# Patient Record
Sex: Female | Born: 1970 | Race: Black or African American | Hispanic: No | Marital: Married | Smoking: Never smoker
Health system: Southern US, Community
[De-identification: ages and names within clinical notes are randomized; demographics above are authoritative.]

## PROBLEM LIST (undated history)

## (undated) DIAGNOSIS — E119 Type 2 diabetes mellitus without complications: Secondary | ICD-10-CM

## (undated) DIAGNOSIS — G43809 Other migraine, not intractable, without status migrainosus: Secondary | ICD-10-CM

## (undated) DIAGNOSIS — I10 Essential (primary) hypertension: Secondary | ICD-10-CM

## (undated) HISTORY — DX: Essential (primary) hypertension: I10

## (undated) HISTORY — DX: Type 2 diabetes mellitus without complications: E11.9

---
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
## 2015-11-19 HISTORY — PX: ANKLE SURGERY: SHX546

## 2017-02-17 DIAGNOSIS — I10 Essential (primary) hypertension: Secondary | ICD-10-CM | POA: Insufficient documentation

## 2017-02-17 DIAGNOSIS — E119 Type 2 diabetes mellitus without complications: Secondary | ICD-10-CM | POA: Insufficient documentation

## 2017-03-25 DIAGNOSIS — K760 Fatty (change of) liver, not elsewhere classified: Secondary | ICD-10-CM | POA: Insufficient documentation

## 2017-08-16 LAB — HM PAP SMEAR: HM PAP: ABNORMAL

## 2017-08-24 DIAGNOSIS — R87610 Atypical squamous cells of undetermined significance on cytologic smear of cervix (ASC-US): Secondary | ICD-10-CM | POA: Insufficient documentation

## 2017-09-02 DIAGNOSIS — Z91018 Allergy to other foods: Secondary | ICD-10-CM | POA: Insufficient documentation

## 2017-11-18 HISTORY — PX: HERNIA REPAIR: SHX51

## 2018-02-04 DIAGNOSIS — G4733 Obstructive sleep apnea (adult) (pediatric): Secondary | ICD-10-CM | POA: Insufficient documentation

## 2018-02-04 DIAGNOSIS — Z9989 Dependence on other enabling machines and devices: Secondary | ICD-10-CM

## 2018-03-03 ENCOUNTER — Encounter: Payer: Self-pay | Admitting: Osteopathic Medicine

## 2018-03-03 ENCOUNTER — Ambulatory Visit (INDEPENDENT_AMBULATORY_CARE_PROVIDER_SITE_OTHER): Payer: Managed Care, Other (non HMO) | Admitting: Osteopathic Medicine

## 2018-03-03 VITALS — BP 134/68 | HR 84 | Temp 97.6°F | Ht 67.0 in | Wt 254.9 lb

## 2018-03-03 DIAGNOSIS — E1165 Type 2 diabetes mellitus with hyperglycemia: Secondary | ICD-10-CM | POA: Diagnosis not present

## 2018-03-03 MED ORDER — LIRAGLUTIDE -WEIGHT MANAGEMENT 18 MG/3ML ~~LOC~~ SOPN: 3.0000 mg | PEN_INJECTOR | Freq: Every day | SUBCUTANEOUS | 0 refills | Status: DC

## 2018-03-03 NOTE — Patient Instructions (Addendum)
Plan:  Continue Metformin  Add Saxenda - see prescription and list   When on maximum dose Saxenda, we may need to add a third medication A1C recheck will be due 05/19/2018 or after than, but I'd like to see you sooner to check how you're doing on the medicines and discuss a few other diabetes topics for prevention of complications   Goal A1C is 7% - we can get there one way or another but let's avoid Insulin for now if we can!

## 2018-03-03 NOTE — Progress Notes (Signed)
HPI: Bridget Lawrence is a 47 y.o. female who  has no past medical history on file.  she presents to [facility] today, 03/03/18,  for chief complaint of: Establish care  Diabetes, High Blood Pressure   DM2 - poor A1C control >1 year, previous Dr had gone up on metformin, per patient other Rx not discussed. She was on Victoza in the past when moved here from [place], but was told this caused liver enzyme problems, reviewed liver labs and they were a bit high off the medicine too. Hx fatty liver. OK about diet/exercise but could be better.   HTN: controlled, no CP/SOB, no HA/VC   Past medical, surgical, social and family history reviewed:  Patient Active Problem List   Diagnosis Date Noted  . OSA on CPAP 02/04/2018  . Food allergy 09/02/2017  . ASCUS of cervix with negative high risk HPV 08/24/2017  . Fatty liver 03/25/2017  . Essential hypertension 02/17/2017  . Type 2 diabetes mellitus without complication, without long-term current use of insulin (HCC) 02/17/2017   Past Surgical History:  Procedure Laterality Date  . ANKLE SURGERY  11/19/2015   Social History   Tobacco Use  . Smoking status: Never Smoker  . Smokeless tobacco: Never Used  Substance Use Topics  . Alcohol use: Yes    Comment: 4/wk   Family History  Problem Relation Age of Onset  . Heart attack Mother   . Diabetes Mother   . High blood pressure Mother   . Stroke Mother   . Heart attack Sister   . Diabetes Sister   . High blood pressure Sister   . Diabetes Brother   . High blood pressure Brother   . Stroke Maternal Grandmother      Current medication list and allergy/intolerance information reviewed:      Allergies  Allergen Reactions  . Doxycycline Other (See Comments)    Pt states," I don't remember.' Pt states," I don't remember.'   . Gabapentin Other (See Comments)    Slurred speech.  Slurred speech.        Review of Systems:  Constitutional:  No   fever, no chills, No recent illness, No unintentional weight changes. No significant fatigue.   HEENT: No  headache, no vision change, no hearing change, No sore throat, No  sinus pressure  Cardiac: No  chest pain, No  pressure, No palpitations, No  Orthopnea  Respiratory:  No  shortness of breath. No  Cough  Gastrointestinal: No  abdominal pain, No  nausea, No  vomiting,  No  blood in stool, No  diarrhea, No  constipation   Musculoskeletal: No new myalgia/arthralgia  Skin: No  Rash, No other wounds/concerning lesions  Genitourinary: No  incontinence, No  abnormal genital bleeding, No abnormal genital discharge  Hem/Onc: No  easy bruising/bleeding, No  abnormal lymph node  Endocrine: No cold intolerance,  No heat intolerance. No polyuria/polydipsia/polyphagia   Neurologic: No  weakness, No  dizziness, No  slurred speech/focal weakness/facial droop  Psychiatric: No  concerns with depression, No  concerns with anxiety, No sleep problems, No mood problems  Exam:  BP 134/68 (BP Location: Left Arm, Patient Position: Sitting, Cuff Size: Large)   Pulse 84   Temp 97.6 F (36.4 C) (Oral)   Ht 5\' 7"  (1.702 m)   Wt 254 lb 14.4 oz (115.6 kg)   BMI 39.92 kg/m   Constitutional: VS see above. General Appearance: alert, well-developed, well-nourished, NAD  Eyes: Normal lids and  conjunctive, non-icteric sclera  Ears, Nose, Mouth, Throat: MMM, Normal external inspection ears/nares/mouth/lips/gums.   Neck: No masses, trachea midline. No thyroid enlargement. No tenderness/mass appreciated. No lymphadenopathy  Respiratory: Normal respiratory effort. no wheeze, no rhonchi, no rales  Cardiovascular: S1/S2 normal, no murmur, no rub/gallop auscultated. RRR. No lower extremity edema.   Musculoskeletal: Gait normal.   Neurological: Normal balance/coordination. No tremor. No cranial nerve deficit on limited exam. Motor and sensation intact and symmetric. Cerebellar reflexes intact.   Skin:  warm, dry, intact. No rash/ulcer.   Psychiatric: Normal judgment/insight. Normal mood and affect. Oriented x3.   Care Everywhere, last A1C >11 few weeks ago!   ASSESSMENT/PLAN:   Uncontrolled type 2 diabetes mellitus with hyperglycemia [facility])    Patient Instructions  Plan:  Continue Metformin  Add Saxenda - see prescription and list   When on maximum dose Saxenda, we may need to add a third medication A1C recheck will be due 05/19/2018 or after than, but I'd like to see you sooner to check how you're doing on the medicines and discuss a few other diabetes topics for prevention of complications   Goal A1C is 7% - we can get there one way or another but let's avoid Insulin for now if we can!        Visit summary with medication list and pertinent instructions was printed for patient to review. All questions at time of visit were answered - patient instructed to contact office with any additional concerns. ER/RTC precautions were reviewed with the patient.   Follow-up plan: Return in about 6 weeks (around 04/14/2018) for monitor diabetes (no A1C) and check on medications for sugars .    Please note: voice recognition software was used to produce this document, and typos may escape review. Please contact Dr. Lyn Hollingshead for any needed clarifications.

## 2018-03-04 ENCOUNTER — Telehealth: Payer: Self-pay

## 2018-03-04 DIAGNOSIS — Z6839 Body mass index (BMI) 39.0-39.9, adult: Secondary | ICD-10-CM | POA: Insufficient documentation

## 2018-03-04 NOTE — Telephone Encounter (Signed)
Called covermymeds and spoke with [facility] and he wanted to clarfiy the patients DOB and I confirmed the DOB on file. Bridget Lawrence will fax a form to the office to complete further information per insurance request.

## 2018-03-04 NOTE — Telephone Encounter (Signed)
Thanks for the update

## 2018-03-04 NOTE — Telephone Encounter (Signed)
I wanted to make sure this is for weight loss and diabetes management. Can you give me a Dx code for this medication? Please advise. Thanks

## 2018-03-04 NOTE — Telephone Encounter (Signed)
E11.9 Diabetes Z68.39 Obesity BMI 39.0-39.9 E66.1 Morbid Obesity

## 2018-03-04 NOTE — Telephone Encounter (Signed)
Lequita Halt from Cover my Meds left a vm msg stating that an error was made with obtaining PA approval for Saxenda. She is requesting a call back at [phone], Ref. # [id number]. Thanks.

## 2018-03-05 NOTE — Telephone Encounter (Signed)
Forms faxed to [facility] with Diagnoses codes on the form per Dr. Lyn Hollingshead. Awaiting determination.

## 2018-03-25 ENCOUNTER — Telehealth: Payer: Self-pay

## 2018-03-25 DIAGNOSIS — E119 Type 2 diabetes mellitus without complications: Secondary | ICD-10-CM

## 2018-03-25 MED ORDER — INSULIN PEN NEEDLE 32G X 6 MM MISC: 1 refills | Status: DC

## 2018-03-25 NOTE — Telephone Encounter (Signed)
Pt called stating she is in need of 32g needles for Saxenda rx. She is requesting rx be sent to [facility] in [place]. Thanks.

## 2018-04-07 ENCOUNTER — Other Ambulatory Visit: Payer: Self-pay | Admitting: Osteopathic Medicine

## 2018-04-07 NOTE — Telephone Encounter (Signed)
[facility] requesting RF on Saxenda.   Last sent 03-03-18 for #3

## 2018-04-08 NOTE — Telephone Encounter (Signed)
Sent!

## 2018-04-14 ENCOUNTER — Encounter: Payer: Self-pay | Admitting: Osteopathic Medicine

## 2018-04-14 ENCOUNTER — Ambulatory Visit (INDEPENDENT_AMBULATORY_CARE_PROVIDER_SITE_OTHER): Payer: Managed Care, Other (non HMO) | Admitting: Osteopathic Medicine

## 2018-04-14 VITALS — BP 131/83 | HR 114 | Temp 99.6°F | Wt 253.8 lb

## 2018-04-14 DIAGNOSIS — Z6839 Body mass index (BMI) 39.0-39.9, adult: Secondary | ICD-10-CM | POA: Diagnosis not present

## 2018-04-14 DIAGNOSIS — E119 Type 2 diabetes mellitus without complications: Secondary | ICD-10-CM

## 2018-04-14 DIAGNOSIS — J069 Acute upper respiratory infection, unspecified: Secondary | ICD-10-CM | POA: Diagnosis not present

## 2018-04-14 MED ORDER — LIRAGLUTIDE -WEIGHT MANAGEMENT 18 MG/3ML ~~LOC~~ SOPN: 3.0000 mg | PEN_INJECTOR | Freq: Every day | SUBCUTANEOUS | 3 refills | Status: DC

## 2018-04-14 MED ORDER — OLMESARTAN MEDOXOMIL-HCTZ 20-12.5 MG PO TABS: 1.0000 | ORAL_TABLET | Freq: Every day | ORAL | 3 refills | Status: DC

## 2018-04-14 MED ORDER — METFORMIN HCL 500 MG PO TABS: 1000.0000 mg | ORAL_TABLET | Freq: Two times a day (BID) | ORAL | 3 refills | Status: DC

## 2018-04-14 NOTE — Progress Notes (Signed)
HPI: Bridget Lawrence is a 47 y.o. female who  has a past medical history of Diabetes (HCC) and High blood pressure.  she presents to [facility] today, 04/14/18,  for chief complaint of: Follow up: Diabetes, High Blood Pressure   DM2 - poor A1C control >1 year, previous Dr had gone up on metformin, per patient other Rx not discussed. She was on Victoza in the past when moved here from [place], but was told this caused liver enzyme problems, reviewed liver labs and they were a bit high off the medicine too. Hx fatty liver. OK about diet/exercise but could be better. At her visit to establish with me about a month ago, 03/03/18, we decided continue Metformin, adding Saxenda, consider third agent. She was recently in the hospital for surgery - 03/23/18 A1C 9.2% down from 02/17/18 was 11.1%. She states the AM sugars have been around 125, after eating 160-170's. Saxenda tolerated well, except itching some at site of insertion. 04/02/18 had labs done at [facility]   HTN: controlled, no CP/SOB, no HA/VC  New cough and sore throat, <1 day, feels like might be coming down with somehting.   Past medical, surgical, social and family history reviewed:  Patient Active Problem List   Diagnosis Date Noted  . BMI 39.0-39.9,adult 03/04/2018  . Morbid obesity (HCC) 03/04/2018  . OSA on CPAP 02/04/2018  . Food allergy 09/02/2017  . ASCUS of cervix with negative high risk HPV 08/24/2017  . Fatty liver 03/25/2017  . Essential hypertension 02/17/2017  . Type 2 diabetes mellitus without complication, without long-term current use of insulin (HCC) 02/17/2017   Past Surgical History:  Procedure Laterality Date  . ANKLE SURGERY  11/19/2015   Social History   Tobacco Use  . Smoking status: Never Smoker  . Smokeless tobacco: Never Used  Substance Use Topics  . Alcohol use: Yes    Comment: 4/wk   Family History  Problem Relation Age of Onset  . Heart attack  Mother   . Diabetes Mother   . High blood pressure Mother   . Stroke Mother   . Heart attack Sister   . Diabetes Sister   . High blood pressure Sister   . Diabetes Brother   . High blood pressure Brother   . Stroke Maternal Grandmother      Current medication list and allergy/intolerance information reviewed:      Allergies  Allergen Reactions  . Doxycycline Other (See Comments)    Pt states," I don't remember.' Pt states," I don't remember.'   . Gabapentin Other (See Comments)    Slurred speech.  Slurred speech.        Review of Systems:  Constitutional:  No  fever, no chills  HEENT: No  headache  Cardiac: No  chest pain, No  pressure, No palpitations  Respiratory:  No  shortness of breath.   Gastrointestinal: No  abdominal pain, No  nausea, No  vomiting  Musculoskeletal: No new myalgia/arthralgia  Skin: No  Rash  Endocrine: No polyuria/polydipsia/polyphagia   Neurologic: No  weakness, No  dizziness  Exam:  BP 131/83 (BP Location: Left Arm, Patient Position: Sitting, Cuff Size: Large)   Pulse (!) 114   Temp 99.6 F (37.6 C) (Oral)   Wt 253 lb 12.8 oz (115.1 kg)   BMI 39.75 kg/m   Constitutional: VS see above. General Appearance: alert, well-developed, well-nourished, NAD  Eyes: Normal lids and conjunctive, non-icteric sclera  Ears, Nose, Mouth, Throat: MMM,  Normal external inspection ears/nares/mouth/lips/gums.   Neck: No masses, trachea midline.   Respiratory: Normal respiratory effort. no wheeze, no rhonchi, no rales  Cardiovascular: S1/S2 normal, no murmur, no rub/gallop auscultated. RRR. No lower extremity edema.   Skin: warm, dry, intact. No rash/ulcer.   Psychiatric: Normal judgment/insight. Normal mood and affect. Oriented x3.   Pt labs from [facility]: AST, ALT improved bu not normal, A1C 9    ASSESSMENT/PLAN:   Type 2 diabetes mellitus without complication, without long-term current use of insulin (HCC) - Some itching w/ Saxends  injections, no serious allergy, pt not interested in switching meds at this time, RTC/ER precautions reviewed, advised 2nd Gen AH. A1C better, pt would like to avoid triple therapy for noe - Plan: COMPLETE METABOLIC PANEL WITH GFR  BMI 39.0-39.9,adult - no major weight loss on Saxenda, will monitor   Viral URI - printed OTC instructions, see me if worse     Patient Instructions  For itching: Can try OTC allergy meds daily to prevent itching, Zyrtec, Claritin, Allegra or any of their generics. Can also use cream: benadryl or hydrocortisone      Over-the-Counter Medications & Home Remedies for Upper Respiratory Illness  Note: the following list assumes no pregnancy, normal liver & kidney function and no other drug interactions. Dr. Lyn Hollingshead has highlighted medications which are safe for you to use, but these may not be appropriate for everyone. Always ask a pharmacist or qualified medical provider if you have any questions!   Aches/Pains, Fever, Headache Acetaminophen (Tylenol) 500 mg tablets - take max 2 tablets (1000 mg) every 6 hours (4 times per day)  Ibuprofen (Motrin) 200 mg tablets - take max 4 tablets (800 mg) every 6 hours*  Sinus Congestion Prescription Atrovent as directed Nasal Saline if desired Oxymetolazone (Afrin, others) sparing use due to rebound congestion, NEVER use in kids Phenylephrine (Sudafed) 10 mg tablets every 4 hours (or the 12-hour formulation)* Diphenhydramine (Benadryl) 25 mg tablets - take max 2 tablets every 4 hours  Cough & Sore Throat Prescription cough pills or syrups as directed Dextromethorphan (Robitussin, others) - cough suppressant Guaifenesin (Robitussin, Mucinex, others) - expectorant (helps cough up mucus) (Dextromethorphan and Guaifenesin also come in a combination tablet) Lozenges w/ Benzocaine + Menthol (Cepacol) Honey - as much as you want! Teas which "coat the throat" - look for ingredients Elm Bark, Licorice Root, Marshmallow  Root  Other Antibiotics if these are prescribed - take ALL, even if you're feeling better  Zinc Lozenges within 24 hours of symptoms onset - mixed evidence this shortens the duration of the common cold Don't waste your money on Vitamin C or Echinacea  *Caution in patients with high blood pressure       Visit summary with medication list and pertinent instructions was printed for patient to review. All questions at time of visit were answered - patient instructed to contact office with any additional concerns. ER/RTC precautions were reviewed with the patient.   Follow-up plan: Return for A1C around 06/23/18.    Please note: voice recognition software was used to produce this document, and typos may escape review. Please contact Dr. Lyn Hollingshead for any needed clarifications.

## 2018-04-14 NOTE — Patient Instructions (Addendum)
For itching: Can try OTC allergy meds daily to prevent itching, Zyrtec, Claritin, Allegra or any of their generics. Can also use cream: benadryl or hydrocortisone      Over-the-Counter Medications & Home Remedies for Upper Respiratory Illness  Note: the following list assumes no pregnancy, normal liver & kidney function and no other drug interactions. Dr. Lyn Hollingshead has highlighted medications which are safe for you to use, but these may not be appropriate for everyone. Always ask a pharmacist or qualified medical provider if you have any questions!   Aches/Pains, Fever, Headache Acetaminophen (Tylenol) 500 mg tablets - take max 2 tablets (1000 mg) every 6 hours (4 times per day)  Ibuprofen (Motrin) 200 mg tablets - take max 4 tablets (800 mg) every 6 hours*  Sinus Congestion Prescription Atrovent as directed Nasal Saline if desired Oxymetolazone (Afrin, others) sparing use due to rebound congestion, NEVER use in kids Phenylephrine (Sudafed) 10 mg tablets every 4 hours (or the 12-hour formulation)* Diphenhydramine (Benadryl) 25 mg tablets - take max 2 tablets every 4 hours  Cough & Sore Throat Prescription cough pills or syrups as directed Dextromethorphan (Robitussin, others) - cough suppressant Guaifenesin (Robitussin, Mucinex, others) - expectorant (helps cough up mucus) (Dextromethorphan and Guaifenesin also come in a combination tablet) Lozenges w/ Benzocaine + Menthol (Cepacol) Honey - as much as you want! Teas which "coat the throat" - look for ingredients Elm Bark, Licorice Root, Marshmallow Root  Other Antibiotics if these are prescribed - take ALL, even if you're feeling better  Zinc Lozenges within 24 hours of symptoms onset - mixed evidence this shortens the duration of the common cold Don't waste your money on Vitamin C or Echinacea  *Caution in patients with high blood pressure

## 2018-06-16 ENCOUNTER — Telehealth: Payer: Self-pay

## 2018-06-16 ENCOUNTER — Other Ambulatory Visit: Payer: Self-pay | Admitting: Osteopathic Medicine

## 2018-06-16 MED ORDER — LIRAGLUTIDE -WEIGHT MANAGEMENT 18 MG/3ML ~~LOC~~ SOPN: 3.0000 mg | PEN_INJECTOR | Freq: Every day | SUBCUTANEOUS | 3 refills | Status: DC

## 2018-06-16 NOTE — Telephone Encounter (Signed)
As per pt - she was informed by [facility] that Saxenda rx will no longer be covered by ins as a 30d supply. Pt called and verify that insurance will pay for a 90 day rx. She is requesting a 90d Saxenda rx be sent to the pharmacy. Pls advise, thanks.

## 2018-06-16 NOTE — Telephone Encounter (Signed)
90day supply sent.

## 2018-06-17 NOTE — Telephone Encounter (Signed)
Left VM with status update.  

## 2018-06-18 ENCOUNTER — Telehealth: Payer: Self-pay

## 2018-06-18 MED ORDER — LIRAGLUTIDE -WEIGHT MANAGEMENT 18 MG/3ML ~~LOC~~ SOPN: 3.0000 mg | PEN_INJECTOR | Freq: Every day | SUBCUTANEOUS | 3 refills | Status: DC

## 2018-06-18 NOTE — Telephone Encounter (Signed)
Pt has been updated.  

## 2018-06-18 NOTE — Telephone Encounter (Signed)
Sent!

## 2018-06-18 NOTE — Telephone Encounter (Signed)
Pt requesting for Saxenda to be sent to [facility]. Rx was cancelled at [facility] (spoke to pharmacist Toni Amend). Pls send new rx to [facility]. Thanks.

## 2018-06-22 NOTE — Telephone Encounter (Signed)
Message from Plan CaseId:[id number];Status:Approved;Review Type:Prior Auth;Coverage Start Date:05/23/2018;Coverage End Date:10/20/2018; -hsm.

## 2018-06-23 ENCOUNTER — Ambulatory Visit: Payer: Managed Care, Other (non HMO) | Admitting: Osteopathic Medicine

## 2018-06-30 ENCOUNTER — Ambulatory Visit (INDEPENDENT_AMBULATORY_CARE_PROVIDER_SITE_OTHER): Payer: Managed Care, Other (non HMO) | Admitting: Osteopathic Medicine

## 2018-06-30 ENCOUNTER — Encounter: Payer: Self-pay | Admitting: Osteopathic Medicine

## 2018-06-30 ENCOUNTER — Ambulatory Visit (INDEPENDENT_AMBULATORY_CARE_PROVIDER_SITE_OTHER): Payer: Managed Care, Other (non HMO)

## 2018-06-30 VITALS — BP 127/61 | HR 90 | Temp 98.7°F | Resp 96 | Wt 253.6 lb

## 2018-06-30 DIAGNOSIS — R059 Cough, unspecified: Secondary | ICD-10-CM

## 2018-06-30 DIAGNOSIS — G4489 Other headache syndrome: Secondary | ICD-10-CM | POA: Diagnosis not present

## 2018-06-30 DIAGNOSIS — R0602 Shortness of breath: Secondary | ICD-10-CM | POA: Diagnosis not present

## 2018-06-30 DIAGNOSIS — R0789 Other chest pain: Secondary | ICD-10-CM

## 2018-06-30 DIAGNOSIS — J3489 Other specified disorders of nose and nasal sinuses: Secondary | ICD-10-CM

## 2018-06-30 DIAGNOSIS — R05 Cough: Secondary | ICD-10-CM | POA: Diagnosis not present

## 2018-06-30 DIAGNOSIS — E119 Type 2 diabetes mellitus without complications: Secondary | ICD-10-CM

## 2018-06-30 LAB — POCT GLYCOSYLATED HEMOGLOBIN (HGB A1C): HEMOGLOBIN A1C: 7.5 % — AB (ref 4.0–5.6)

## 2018-06-30 MED ORDER — FLUTICASONE PROPIONATE 50 MCG/ACT NA SUSP: NASAL | 3 refills | Status: DC

## 2018-06-30 MED ORDER — AZITHROMYCIN 250 MG PO TABS: ORAL_TABLET | ORAL | 0 refills | Status: DC

## 2018-06-30 NOTE — Patient Instructions (Addendum)
Your symptoms aren't really consistent with a serious respiratory problem such as pneumonia, asthma, or COPD. They're also not really concerning for a serious cardiac problem like heart disease or blood clot. Im more suspicious of a bronchitis or sinus infection or allergy reaction, but given the vague and intermittent nature of symptoms we should keep other things in mind especially if you aren't getting better as expected.    We will get a chest Xray for further evaluation of pneumonia or other lung issue. Will get labs today as well. One of these labs is ordered STAT to let us know if the heart is at risk, if this level (troponin) is positive, you will get a call to go to the closes hospital ER!   We should consider lung function testing in the office to rule out asthma/COPD or other lung disease, or stress test/cardiology referral to rule out heart disease.   We will go ahead and treat with antibiotics Azithromycin for bacterial sinusitis/bronchitis which may be causing your intermittent symptoms. I also suspect possible allergies so will add a steroid nasal spray Flonase and I'd recommend consistent use of daily Claritin 10 mg for a week or so. We should consider oral steroid burst and/or steroid inhaler if these measures aren't helping.   If your pain worsens, especially if more short winded or more severe chest pain, please call 911 or proceed to the nearest emergency room! I don't have a strong suspicion at this point for a cardiac issue, and EKG today looks okay, but if something changes, please take it seriously!

## 2018-06-30 NOTE — Progress Notes (Signed)
HPI: Bridget Lawrence is a 47 y.o. female who  has a past medical history of Diabetes (HCC) and High blood pressure.  she presents to [facility] today, 06/30/18,  for chief complaint of:  Respiratory complaints   Chest tightness and SOB on and off for a few weeks now. New frontal headache in middle-ish forehead near eyebrows, alternating sinus pressure L and R, ear aches. Chest tightness and SOB on exertion more concerning, no orthopnea, no significant coughing. OTC sinus meds and nebs not helping. Nasonex/Flonase previously.     Past medical history, surgical history, and family history reviewed.  Current medication list and allergy/intolerance information reviewed.   (See remainder of HPI, ROS, Phys Exam below)  BP 127/61 (BP Location: Left Arm, Patient Position: Sitting, Cuff Size: Large)   Pulse 90   Temp 98.7 F (37.1 C) (Oral)   Resp (!) 96   Wt 253 lb 9.6 oz (115 kg)   BMI 39.72 kg/m   Results for orders placed or performed in visit on 06/30/18 (from the past 24 hour(s))  POCT HgB A1C     Status: Abnormal   Collection Time: 06/30/18  3:31 PM  Result Value Ref Range   Hemoglobin A1C 7.5 (A) 4.0 - 5.6 %   HbA1c POC (<> result, manual entry)     HbA1c, POC (prediabetic range)     HbA1c, POC (controlled diabetic range)       ASSESSMENT/PLAN: The primary encounter diagnosis was Feeling of chest tightness. Diagnoses of Sinus pressure, Cough, Other headache syndrome, Type 2 diabetes mellitus without complication, without long-term current use of insulin (HCC), and Short of breath on exertion were also pertinent to this visit.  Intermittent symptoms, simplest explanation is uncontrolled allergies w/ possible asthma component but new onset chest tightness (no palpitations or SOB at rest or mild exertion, only w/ climbing stairs) in a middle-aged HTN/DM pt concerning for atypical chest pain presentation.   Troponin ordered. EKG ok in office.  On-call provider is informed. Pt is informed as below.    Orders Placed This Encounter  Procedures  . DG Chest 2 View  . CBC with Differential/Platelet  . COMPLETE METABOLIC PANEL WITH GFR  . TSH  . B Nat Peptide  . Troponin I  . POCT HgB A1C     Meds ordered this encounter  Medications  . azithromycin (ZITHROMAX) 250 MG tablet    Sig: 2 tabs po on Day 1, then 1 tab daily Days 2 - 5    Dispense:  6 tablet    Refill:  0  . fluticasone (FLONASE) 50 MCG/ACT nasal spray    Sig: One spray in each nostril twice a day, use left hand for right nostril, and right hand for left nostril.    Dispense:  48 g    Refill:  3    Patient Instructions  Your symptoms aren't really consistent with a serious respiratory problem such as pneumonia, asthma, or COPD. They're also not really concerning for a serious cardiac problem like heart disease or blood clot. Im more suspicious of a bronchitis or sinus infection or allergy reaction, but given the vague and intermittent nature of symptoms we should keep other things in mind especially if you aren't getting better as expected.    We will get a chest Xray for further evaluation of pneumonia or other lung issue. Will get labs today as well. One of these labs is ordered STAT to let us know if the  heart is at risk, if this level (troponin) is positive, you will get a call to go to the closes hospital ER!   We should consider lung function testing in the office to rule out asthma/COPD or other lung disease, or stress test/cardiology referral to rule out heart disease.   We will go ahead and treat with antibiotics Azithromycin for bacterial sinusitis/bronchitis which may be causing your intermittent symptoms. I also suspect possible allergies so will add a steroid nasal spray Flonase and I'd recommend consistent use of daily Claritin 10 mg for a week or so. We should consider oral steroid burst and/or steroid inhaler if these measures aren't helping.   If your  pain worsens, especially if more short winded or more severe chest pain, please call 911 or proceed to the nearest emergency room! I don't have a strong suspicion at this point for a cardiac issue, and EKG today looks okay, but if something changes, please take it seriously!       Follow-up plan: Return in about 1 week (around 07/07/2018) for recheck symptoms .                 ############################################ ############################################ ############################################ ############################################    Outpatient Encounter Medications as of 06/30/2018  Medication Sig Note  . aspirin EC 325 MG tablet Take by mouth.   Marland Kitchen azelastine (ASTELIN) 0.1 % nasal spray U 1 SPR IEN BID UTD 04/14/2018: PRN  . B-D ULTRA-FINE 33 LANCETS MISC by Does not apply route.   . Cholecalciferol (VITAMIN D) 2000 units CAPS Take by mouth.   . fluconazole (DIFLUCAN) 150 MG tablet 1 by mouth every day now and repeat in 3 days   . glucose blood (ONE TOUCH ULTRA TEST) test strip Use to check blood glucose daily and as needed   . Glutamic Acid HCl (L-GLUTAMIC ACID PO) Take by mouth.   . Insulin Pen Needle (NOVOFINE) 32G X 6 MM MISC Use subcutaneously daily with Liraglutide   . ipratropium-albuterol (DUONEB) 0.5-2.5 (3) MG/3ML SOLN 3 mLs. 04/14/2018: PRN  . Liraglutide -Weight Management (SAXENDA) 18 MG/3ML SOPN Inject 3 mg into the skin daily.   . metFORMIN (GLUCOPHAGE) 500 MG tablet Take 2 tablets (1,000 mg total) by mouth 2 (two) times daily with a meal.   . Methylcobalamin (METHYL B-12) 1000 MCG LOZG Take by mouth.   . olmesartan-hydrochlorothiazide (BENICAR HCT) 20-12.5 MG tablet Take 1 tablet by mouth daily.   . Probiotic Product (PROBIOTIC-10) CAPS Take by mouth.   . thiamine (VITAMIN B-1) 50 MG tablet Take by mouth.   Marland Kitchen VITAMIN K PO Take by mouth.   . Zinc 100 MG TABS Take by mouth.   . Zinc Sulfate (ZINC 15 PO) Take by mouth.    No  facility-administered encounter medications on file as of 06/30/2018.    Allergies  Allergen Reactions  . Doxycycline Other (See Comments)    Pt states," I don't remember.'    . Gabapentin Other (See Comments)    Slurred speech.         Review of Systems:  Constitutional: No recent illness  HEENT: +  headache, no vision change  Cardiac: +chest pain, +pressure, No palpitations  Respiratory: +shortness of breath. No  Cough  Gastrointestinal: No  abdominal pain, no change on bowel habits  Musculoskeletal: No new myalgia/arthralgia  Neurologic: No  weakness, No  Dizziness  Exam:  BP 127/61 (BP Location: Left Arm, Patient Position: Sitting, Cuff Size: Large)   Pulse 90   Temp  98.7 F (37.1 C) (Oral)   Resp (!) 96   Wt 253 lb 9.6 oz (115 kg)   BMI 39.72 kg/m   Constitutional: VS see above. General Appearance: alert, well-developed, well-nourished, NAD  Eyes: Normal lids and conjunctive, non-icteric sclera  Ears, Nose, Mouth, Throat: MMM, Normal external inspection ears/nares/mouth/lips/gums.  Neck: No masses, trachea midline.   Respiratory: Normal respiratory effort. no wheeze, no rhonchi, no rales  Cardiovascular: S1/S2 normal, no murmur, no rub/gallop auscultated. RRR.   Musculoskeletal: Gait normal. Symmetric and independent movement of all extremities  Neurological: Normal balance/coordination. No tremor.  Skin: warm, dry, intact.   Psychiatric: Normal judgment/insight. Normal mood and affect. Oriented x3.   Visit summary with medication list and pertinent instructions was printed for patient to review, advised to alert [place] if any changes needed. All questions at time of visit were answered - patient instructed to contact office with any additional concerns. ER/RTC precautions were reviewed with the patient and understanding verbalized.   Follow-up plan: Return in about 1 week (around 07/07/2018) for recheck symptoms .  Note: Total time spent 25 minutes,  greater than 50% of the visit was spent face-to-face counseling and coordinating care for the following: The primary encounter diagnosis was Feeling of chest tightness. Diagnoses of Sinus pressure, Cough, Other headache syndrome, Type 2 diabetes mellitus without complication, without long-term current use of insulin (HCC), and Short of breath on exertion were also pertinent to this visit.Marland Kitchen  Please note: voice recognition software was used to produce this document, and typos may escape review. Please contact Dr. Lyn Hollingshead for any needed clarifications.

## 2018-07-01 ENCOUNTER — Telehealth: Payer: Self-pay
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
## 2018-07-01 NOTE — Addendum Note (Signed)
Addended by: Delfino Lovett on: 07/01/2018 08:23 AM   Modules accepted: Orders

## 2018-07-01 NOTE — Telephone Encounter (Signed)
noted 

## 2018-07-03 ENCOUNTER — Other Ambulatory Visit: Payer: Self-pay | Admitting: Family Medicine

## 2018-07-03 LAB — TROPONIN T

## 2018-07-04 LAB — CBC/DIFF AMBIGUOUS DEFAULT
BASOS: 0 %
Basophils Absolute: 0 10*3/uL (ref 0.0–0.2)
EOS (ABSOLUTE): 0.1 10*3/uL (ref 0.0–0.4)
Eos: 1 %
HEMATOCRIT: 36.6 % (ref 34.0–46.6)
HEMOGLOBIN: 11.6 g/dL (ref 11.1–15.9)
IMMATURE GRANS (ABS): 0 10*3/uL (ref 0.0–0.1)
IMMATURE GRANULOCYTES: 0 %
Lymphocytes Absolute: 2.4 10*3/uL (ref 0.7–3.1)
Lymphs: 38 %
MCH: 27.6 pg (ref 26.6–33.0)
MCHC: 31.7 g/dL (ref 31.5–35.7)
MCV: 87 fL (ref 79–97)
MONOS ABS: 0.6 10*3/uL (ref 0.1–0.9)
Monocytes: 9 %
NEUTROS ABS: 3.3 10*3/uL (ref 1.4–7.0)
Neutrophils: 52 %
Platelets: 359 10*3/uL (ref 150–450)
RBC: 4.2 x10E6/uL (ref 3.77–5.28)
RDW: 14.1 % (ref 12.3–15.4)
WBC: 6.4 10*3/uL (ref 3.4–10.8)

## 2018-07-04 LAB — TSH: TSH: 3.37 u[IU]/mL (ref 0.450–4.500)

## 2018-07-04 LAB — COMPREHENSIVE METABOLIC PANEL
ALBUMIN: 4.3 g/dL (ref 3.5–5.5)
ALK PHOS: 59 IU/L (ref 39–117)
ALT: 75 IU/L — ABNORMAL HIGH (ref 0–32)
AST: 59 IU/L — AB (ref 0–40)
Albumin/Globulin Ratio: 1.3 (ref 1.2–2.2)
BUN / CREAT RATIO: 22 (ref 9–23)
BUN: 15 mg/dL (ref 6–24)
Bilirubin Total: 0.3 mg/dL (ref 0.0–1.2)
CALCIUM: 9.4 mg/dL (ref 8.7–10.2)
CO2: 23 mmol/L (ref 20–29)
Chloride: 98 mmol/L (ref 96–106)
Creatinine, Ser: 0.68 mg/dL (ref 0.57–1.00)
GFR calc Af Amer: 119 mL/min/{1.73_m2} (ref >59)
GFR, EST NON AFRICAN AMERICAN: 103 mL/min/{1.73_m2} (ref >59)
GLOBULIN, TOTAL: 3.3 g/dL (ref 1.5–4.5)
GLUCOSE: 143 mg/dL — AB (ref 65–99)
Potassium: 4 mmol/L (ref 3.5–5.2)
SODIUM: 136 mmol/L (ref 134–144)
Total Protein: 7.6 g/dL (ref 6.0–8.5)

## 2018-07-04 LAB — BRAIN NATRIURETIC PEPTIDE: BNP: 3 pg/mL (ref 0.0–100.0)

## 2018-07-04 LAB — SPECIMEN STATUS REPORT

## 2018-07-07 ENCOUNTER — Encounter: Payer: Self-pay | Admitting: Osteopathic Medicine

## 2018-07-09 ENCOUNTER — Ambulatory Visit: Payer: Managed Care, Other (non HMO) | Admitting: Osteopathic Medicine

## 2018-07-13 ENCOUNTER — Ambulatory Visit (INDEPENDENT_AMBULATORY_CARE_PROVIDER_SITE_OTHER): Payer: Managed Care, Other (non HMO) | Admitting: Osteopathic Medicine

## 2018-07-13 ENCOUNTER — Encounter: Payer: Self-pay | Admitting: Osteopathic Medicine

## 2018-07-13 VITALS — BP 121/83 | HR 83 | Temp 98.4°F | Wt 253.3 lb

## 2018-07-13 DIAGNOSIS — R748 Abnormal levels of other serum enzymes: Secondary | ICD-10-CM

## 2018-07-13 DIAGNOSIS — R0789 Other chest pain: Secondary | ICD-10-CM

## 2018-07-13 NOTE — Progress Notes (Signed)
HPI: Bridget Lawrence is a 47 y.o. female who  has a past medical history of Diabetes (HCC) and High blood pressure.  she presents to [facility] today, 07/13/18,  for chief complaint of:  Follow-up breathing  Doing better with chest tightness, seems to have resolved. The nasal spray is also helping.   She has reported the issue she experienced at the lab to [facility]. Will follow up as needed. See prvious documentation.       Past medical history, surgical history, and family history reviewed.  Current medication list and allergy/intolerance information reviewed.   (See remainder of HPI, ROS, Phys Exam below)    ASSESSMENT/PLAN:   Feeling of chest tightness - resolved at this time, monitor as needed  Elevated liver enzymes - mild, likely fatty liver, will monitor - advised diet/wt loss     Follow-up plan: Return in about 4 months (around 11/12/2018) for recheck A1C, sooner if needed .                 ############################################ ############################################ ############################################ ############################################    Outpatient Encounter Medications as of 07/13/2018  Medication Sig Note  . azithromycin (ZITHROMAX) 250 MG tablet 2 tabs po on Day 1, then 1 tab daily Days 2 - 5   . Cholecalciferol (VITAMIN D) 2000 units CAPS Take by mouth.   . fluticasone (FLONASE) 50 MCG/ACT nasal spray One spray in each nostril twice a day, use left hand for right nostril, and right hand for left nostril.   Marland Kitchen glucose blood (ONE TOUCH ULTRA TEST) test strip Use to check blood glucose daily and as needed   . Glutamic Acid HCl (L-GLUTAMIC ACID PO) Take by mouth.   . Insulin Pen Needle (NOVOFINE) 32G X 6 MM MISC Use subcutaneously daily with Liraglutide   . ipratropium-albuterol (DUONEB) 0.5-2.5 (3) MG/3ML SOLN 3 mLs. 04/14/2018: PRN  . Liraglutide -Weight Management (SAXENDA) 18 MG/3ML  SOPN Inject 3 mg into the skin daily.   . metFORMIN (GLUCOPHAGE) 500 MG tablet Take 2 tablets (1,000 mg total) by mouth 2 (two) times daily with a meal.   . Methylcobalamin (METHYL B-12) 1000 MCG LOZG Take by mouth.   . olmesartan-hydrochlorothiazide (BENICAR HCT) 20-12.5 MG tablet Take 1 tablet by mouth daily.   . Probiotic Product (PROBIOTIC-10) CAPS Take by mouth.   . thiamine (VITAMIN B-1) 50 MG tablet Take by mouth.   Marland Kitchen VITAMIN K PO Take by mouth.   . Zinc 100 MG TABS Take by mouth.   . Zinc Sulfate (ZINC 15 PO) Take by mouth.    No facility-administered encounter medications on file as of 07/13/2018.    Allergies  Allergen Reactions  . Doxycycline Other (See Comments)    Pt states," I don't remember.'    . Gabapentin Other (See Comments)    Slurred speech.         Review of Systems:  Constitutional: +recent illness  HEENT: No  headache  Cardiac: No  chest pain, No  pressure, No palpitations  Respiratory:  No  shortness of breath. No  Cough  Musculoskeletal: No new myalgia/arthralgia  Skin: No  Rash  Hem/Onc: No  easy bruising/bleeding, No  abnormal lumps/bumps  Neurologic: No  weakness, No  Dizziness   Exam:  BP 121/83 (BP Location: Left Arm, Patient Position: Sitting, Cuff Size: Large)   Pulse 83   Temp 98.4 F (36.9 C) (Oral)   Wt 253 lb 4.8 oz (114.9 kg)   BMI 39.67 kg/m  Constitutional: VS see above. General Appearance: alert, well-developed, well-nourished, NAD  Eyes: Normal lids and conjunctive, non-icteric sclera  Ears, Nose, Mouth, Throat: MMM, Normal external inspection ears/nares/mouth/lips/gums.  Neck: No masses, trachea midline.   Respiratory: Normal respiratory effort. no wheeze, no rhonchi, no rales  Cardiovascular: S1/S2 normal, no murmur, no rub/gallop auscultated. RRR.   Musculoskeletal: Gait normal. Symmetric and independent movement of all extremities  Neurological: Normal balance/coordination. No tremor.  Skin: warm, dry,  intact.   Psychiatric: Normal judgment/insight. Normal mood and affect. Oriented x3.   Visit summary with medication list and pertinent instructions was printed for patient to review, advised to alert [place] if any changes needed. All questions at time of visit were answered - patient instructed to contact office with any additional concerns. ER/RTC precautions were reviewed with the patient and understanding verbalized.   Follow-up plan: Return in about 4 months (around 11/12/2018) for recheck A1C, sooner if needed .  Note: Total time spent 15 minutes, greater than 50% of the visit was spent face-to-face counseling and coordinating care for the following: The primary encounter diagnosis was Feeling of chest tightness. A diagnosis of Elevated liver enzymes was also pertinent to this visit.Marland Kitchen  Please note: voice recognition software was used to produce this document, and typos may escape review. Please contact Dr. Lyn Hollingshead for any needed clarifications.

## 2018-07-22 ENCOUNTER — Other Ambulatory Visit: Payer: Self-pay

## 2018-07-22 DIAGNOSIS — E119 Type 2 diabetes mellitus without complications: Secondary | ICD-10-CM

## 2018-07-22 DIAGNOSIS — J3489 Other specified disorders of nose and nasal sinuses: Secondary | ICD-10-CM

## 2018-07-22 MED ORDER — LIRAGLUTIDE -WEIGHT MANAGEMENT 18 MG/3ML ~~LOC~~ SOPN: 3.0000 mg | PEN_INJECTOR | Freq: Every day | SUBCUTANEOUS | 3 refills | Status: AC

## 2018-07-22 MED ORDER — FLUTICASONE PROPIONATE 50 MCG/ACT NA SUSP: NASAL | 3 refills | Status: DC

## 2018-07-22 MED ORDER — INSULIN PEN NEEDLE 32G X 6 MM MISC: 1 refills | Status: DC

## 2018-09-22 ENCOUNTER — Other Ambulatory Visit: Payer: Self-pay | Admitting: Physician Assistant

## 2018-09-22 DIAGNOSIS — E119 Type 2 diabetes mellitus without complications: Secondary | ICD-10-CM

## 2018-10-30 ENCOUNTER — Encounter: Payer: Self-pay | Admitting: Osteopathic Medicine

## 2018-11-12 ENCOUNTER — Ambulatory Visit: Payer: Managed Care, Other (non HMO) | Admitting: Osteopathic Medicine

## 2018-11-25 DIAGNOSIS — F4322 Adjustment disorder with anxiety: Secondary | ICD-10-CM | POA: Diagnosis not present

## 2018-11-30 DIAGNOSIS — M9903 Segmental and somatic dysfunction of lumbar region: Secondary | ICD-10-CM | POA: Diagnosis not present

## 2018-11-30 DIAGNOSIS — M9901 Segmental and somatic dysfunction of cervical region: Secondary | ICD-10-CM | POA: Diagnosis not present

## 2018-11-30 DIAGNOSIS — M50322 Other cervical disc degeneration at C5-C6 level: Secondary | ICD-10-CM | POA: Diagnosis not present

## 2018-11-30 DIAGNOSIS — M9902 Segmental and somatic dysfunction of thoracic region: Secondary | ICD-10-CM | POA: Diagnosis not present

## 2018-12-02 DIAGNOSIS — F4322 Adjustment disorder with anxiety: Secondary | ICD-10-CM | POA: Diagnosis not present

## 2018-12-07 DIAGNOSIS — S9032XA Contusion of left foot, initial encounter: Secondary | ICD-10-CM | POA: Diagnosis not present

## 2018-12-07 DIAGNOSIS — M7742 Metatarsalgia, left foot: Secondary | ICD-10-CM | POA: Diagnosis not present

## 2018-12-07 DIAGNOSIS — M7732 Calcaneal spur, left foot: Secondary | ICD-10-CM | POA: Diagnosis not present

## 2018-12-08 DIAGNOSIS — M9903 Segmental and somatic dysfunction of lumbar region: Secondary | ICD-10-CM | POA: Diagnosis not present

## 2018-12-08 DIAGNOSIS — M9901 Segmental and somatic dysfunction of cervical region: Secondary | ICD-10-CM | POA: Diagnosis not present

## 2018-12-08 DIAGNOSIS — M50322 Other cervical disc degeneration at C5-C6 level: Secondary | ICD-10-CM | POA: Diagnosis not present

## 2018-12-08 DIAGNOSIS — M9902 Segmental and somatic dysfunction of thoracic region: Secondary | ICD-10-CM | POA: Diagnosis not present

## 2018-12-09 DIAGNOSIS — F4322 Adjustment disorder with anxiety: Secondary | ICD-10-CM | POA: Diagnosis not present

## 2018-12-10 DIAGNOSIS — M50322 Other cervical disc degeneration at C5-C6 level: Secondary | ICD-10-CM | POA: Diagnosis not present

## 2018-12-10 DIAGNOSIS — M9901 Segmental and somatic dysfunction of cervical region: Secondary | ICD-10-CM | POA: Diagnosis not present

## 2018-12-10 DIAGNOSIS — M9902 Segmental and somatic dysfunction of thoracic region: Secondary | ICD-10-CM | POA: Diagnosis not present

## 2018-12-10 DIAGNOSIS — M9903 Segmental and somatic dysfunction of lumbar region: Secondary | ICD-10-CM | POA: Diagnosis not present

## 2018-12-16 DIAGNOSIS — M9902 Segmental and somatic dysfunction of thoracic region: Secondary | ICD-10-CM | POA: Diagnosis not present

## 2018-12-16 DIAGNOSIS — M9903 Segmental and somatic dysfunction of lumbar region: Secondary | ICD-10-CM | POA: Diagnosis not present

## 2018-12-16 DIAGNOSIS — M50322 Other cervical disc degeneration at C5-C6 level: Secondary | ICD-10-CM | POA: Diagnosis not present

## 2018-12-16 DIAGNOSIS — M7742 Metatarsalgia, left foot: Secondary | ICD-10-CM | POA: Diagnosis not present

## 2018-12-16 DIAGNOSIS — M9901 Segmental and somatic dysfunction of cervical region: Secondary | ICD-10-CM | POA: Diagnosis not present

## 2018-12-16 DIAGNOSIS — S9032XA Contusion of left foot, initial encounter: Secondary | ICD-10-CM | POA: Diagnosis not present

## 2018-12-17 ENCOUNTER — Encounter: Payer: Self-pay | Admitting: Osteopathic Medicine

## 2018-12-17 ENCOUNTER — Ambulatory Visit (INDEPENDENT_AMBULATORY_CARE_PROVIDER_SITE_OTHER): Payer: BLUE CROSS/BLUE SHIELD | Admitting: Osteopathic Medicine

## 2018-12-17 VITALS — BP 122/82 | HR 96 | Temp 98.7°F | Wt 262.3 lb

## 2018-12-17 DIAGNOSIS — J3489 Other specified disorders of nose and nasal sinuses: Secondary | ICD-10-CM | POA: Diagnosis not present

## 2018-12-17 DIAGNOSIS — R05 Cough: Secondary | ICD-10-CM

## 2018-12-17 DIAGNOSIS — R059 Cough, unspecified: Secondary | ICD-10-CM

## 2018-12-17 MED ORDER — LIRAGLUTIDE -WEIGHT MANAGEMENT 18 MG/3ML ~~LOC~~ SOPN: 3.0000 mg | PEN_INJECTOR | Freq: Every day | SUBCUTANEOUS | 3 refills | Status: AC

## 2018-12-17 MED ORDER — BENZONATATE 200 MG PO CAPS: 200.0000 mg | ORAL_CAPSULE | Freq: Three times a day (TID) | ORAL | 0 refills | Status: DC | PRN

## 2018-12-17 MED ORDER — AMBULATORY NON FORMULARY MEDICATION: 99 refills | Status: DC

## 2018-12-17 MED ORDER — IPRATROPIUM-ALBUTEROL 0.5-2.5 (3) MG/3ML IN SOLN: 3.0000 mL | RESPIRATORY_TRACT | 3 refills | Status: DC | PRN

## 2018-12-17 NOTE — Patient Instructions (Signed)
Viral Respiratory Infection  A respiratory infection is an illness that affects part of the respiratory system, such as the lungs, nose, or throat. A respiratory infection that is caused by a virus is called a viral respiratory infection.  Common types of viral respiratory infections include:  · A cold.  · The flu (influenza).  · A respiratory syncytial virus (RSV) infection.  What are the causes?  This condition is caused by a virus.  What are the signs or symptoms?  Symptoms of this condition include:  · A stuffy or runny nose.  · Yellow or green nasal discharge.  · A cough.  · Sneezing.  · Fatigue.  · Achy muscles.  · A sore throat.  · Sweating or chills.  · A fever.  · A headache.  How is this diagnosed?  This condition may be diagnosed based on:  · Your symptoms.  · A physical exam.  · Testing of nasal swabs.  How is this treated?  This condition may be treated with medicines, such as:  · Antiviral medicine. This may shorten the length of time a person has symptoms.  · Expectorants. These make it easier to cough up mucus.  · Decongestant nasal sprays.  · Acetaminophen or NSAIDs to relieve fever and pain.  Antibiotic medicines are not prescribed for viral infections. This is because antibiotics are designed to kill bacteria. They are not effective against viruses.  Follow these instructions at home:    Managing pain and congestion  · Take over-the-counter and prescription medicines only as told by your health care provider.  · If you have a sore throat, gargle with a salt-water mixture 3-4 times a day or as needed. To make a salt-water mixture, completely dissolve ½-1 tsp of salt in 1 cup of warm water.  · Use nose drops made from salt water to ease congestion and soften raw skin around your nose.  · Drink enough fluid to keep your urine pale yellow. This helps prevent dehydration and helps loosen up mucus.  General instructions  · Rest as much as possible.  · Do not drink alcohol.  · Do not use any products  that contain nicotine or tobacco, such as cigarettes and e-cigarettes. If you need help quitting, ask your health care provider.  · Keep all follow-up visits as told by your health care provider. This is important.  How is this prevented?    · Get an annual flu shot. You may get the flu shot in late summer, fall, or winter. Ask your health care provider when you should get your flu shot.  · Avoid exposing others to your respiratory infection.  ? Stay home from work or school as told by your health care provider.  ? Wash your hands with soap and water often, especially after you cough or sneeze. If soap and water are not available, use alcohol-based hand sanitizer.  · Avoid contact with people who are sick during cold and flu season. This is generally fall and winter.  Contact a health care provider if:  · Your symptoms last for 10 days or longer.  · Your symptoms get worse over time.  · You have a fever.  · You have severe sinus pain in your face or forehead.  · The glands in your jaw or neck become very swollen.  Get help right away if you:  · Feel pain or pressure in your chest.  · Have shortness of breath.  · Faint or feel like   you will faint.  · Have severe and persistent vomiting.  · Feel confused or disoriented.  Summary  · A respiratory infection is an illness that affects part of the respiratory system, such as the lungs, nose, or throat. A respiratory infection that is caused by a virus is called a viral respiratory infection.  · Common types of viral respiratory infections are a cold, influenza, and respiratory syncytial virus (RSV) infection.  · Symptoms of this condition include a stuffy or runny nose, cough, sneezing, fatigue, achy muscles, sore throat, and fevers or chills.  · Antibiotic medicines are not prescribed for viral infections. This is because antibiotics are designed to kill bacteria. They are not effective against viruses.  This information is not intended to replace advice given to you by  your health care provider. Make sure you discuss any questions you have with your health care provider.  Document Released: 08/14/2005 Document Revised: 12/15/2017 Document Reviewed: 12/15/2017  [employer] Interactive Patient Education © 2019 [employer]

## 2018-12-17 NOTE — Progress Notes (Signed)
HPI: Bridget Lawrence is a 48 y.o. female who  has a past medical history of Diabetes (HCC) and High blood pressure.  she presents to [facility] today, 12/17/18,  for chief complaint of: Sick - cough  . Context: no hx asthma or COPD . Location/Quality: chest congestion, cough . Duration: 1 day . Modifying factors: nebulizer helped but she is out of meds and tubing . Assoc signs/symptoms: sinus congestion, fatigue    Past medical, surgical, social and family history reviewed and updated as necessary.   Current medication list and allergy/intolerance information reviewed:    Current Outpatient Medications  Medication Sig Dispense Refill  . Cholecalciferol (VITAMIN D) 2000 units CAPS Take by mouth.    . fluticasone (FLONASE) 50 MCG/ACT nasal spray One spray in each nostril twice a day, use left hand for right nostril, and right hand for left nostril. 48 g 3  . glucose blood (ONE TOUCH ULTRA TEST) test strip Use to check blood glucose daily and as needed    . Glutamic Acid HCl (L-GLUTAMIC ACID PO) Take by mouth.    . Insulin Pen Needle (NOVOFINE) 32G X 6 MM MISC Use subcutaneously daily with Liraglutide 90 each 1  . Insulin Pen Needle (NOVOFINE) 32G X 6 MM MISC INJECT UNDER THE SKIN DAILY WITH LIRAGLUTIDE 100 each 0  . ipratropium-albuterol (DUONEB) 0.5-2.5 (3) MG/3ML SOLN 3 mLs.    . metFORMIN (GLUCOPHAGE) 500 MG tablet Take 2 tablets (1,000 mg total) by mouth 2 (two) times daily with a meal. 360 tablet 3  . Methylcobalamin (METHYL B-12) 1000 MCG LOZG Take by mouth.    . olmesartan-hydrochlorothiazide (BENICAR HCT) 20-12.5 MG tablet Take 1 tablet by mouth daily. 90 tablet 3  . Probiotic Product (PROBIOTIC-10) CAPS Take by mouth.    . thiamine (VITAMIN B-1) 50 MG tablet Take by mouth.    Marland Kitchen VITAMIN K PO Take by mouth.    . Zinc 100 MG TABS Take by mouth.    . Zinc Sulfate (ZINC 15 PO) Take by mouth.     No current facility-administered medications for  this visit.     Allergies  Allergen Reactions  . Doxycycline Other (See Comments)    Pt states," I don't remember.'    . Gabapentin Other (See Comments)    Slurred speech.         Review of Systems:  Constitutional:  No  fever, no chills, No recent illness, No unintentional weight changes. No significant fatigue.   HEENT: No  headache, no vision change, no hearing change, +sore throat, +sinus pressure  Cardiac: No  chest pain, No  pressure, No palpitations  Respiratory:  No  shortness of breath. +Cough  Gastrointestinal: No  abdominal pain, No  nausea, No  vomiting,  No  blood in stool, No  diarrhea  Musculoskeletal: No new myalgia/arthralgia  Skin: No  Rash  Neurologic: No  weakness, No  dizziness  Exam:  BP 122/82 (BP Location: Left Arm, Patient Position: Sitting, Cuff Size: Normal)   Pulse 96   Temp 98.7 F (37.1 C) (Oral)   Wt 262 lb 4.8 oz (119 kg)   BMI 41.08 kg/m   Constitutional: VS see above. General Appearance: alert, well-developed, well-nourished, NAD  Eyes: Normal lids and conjunctive, non-icteric sclera  Ears, Nose, Mouth, Throat: MMM, Normal external inspection ears/nares/mouth/lips/gums. TM normal bilaterally. Pharynx/tonsils no erythema, no exudate. Nasal mucosa normal.   Neck: No masses, trachea midline. No tenderness/mass appreciated. No lymphadenopathy  Respiratory:  Normal respiratory effort. no wheeze, no rhonchi, no rales  Cardiovascular: S1/S2 normal, no murmur, no rub/gallop auscultated. RRR. No lower extremity edema.   Gastrointestinal: Nontender, no masses. Bowel sounds normal.  Musculoskeletal: Gait normal.   Neurological: Normal balance/coordination. No tremor.   Skin: warm, dry, intact.   Psychiatric: Normal judgment/insight. Normal mood and affect.          ASSESSMENT/PLAN: The primary encounter diagnosis was Cough. A diagnosis of Sinus pressure was also pertinent to this visit.   Meds ordered this encounter    Medications  . ipratropium-albuterol (DUONEB) 0.5-2.5 (3) MG/3ML SOLN    Sig: Inhale 3 mLs into the lungs every 4 (four) hours as needed.    Dispense:  360 mL    Refill:  3  . Liraglutide -Weight Management (SAXENDA) 18 MG/3ML SOPN    Sig: Inject 3 mg into the skin daily.    Dispense:  45 mL    Refill:  3  . benzonatate (TESSALON) 200 MG capsule    Sig: Take 1 capsule (200 mg total) by mouth 3 (three) times daily as needed for cough.    Dispense:  30 capsule    Refill:  0  . AMBULATORY NON FORMULARY MEDICATION    Sig: Medication Name: nebulizer tubing and mouthpiece per patient preference and insurance coverage    Dispense:  1 Units    Refill:  prn    No orders of the defined types were placed in this encounter.   Patient Instructions  Viral Respiratory Infection A respiratory infection is an illness that affects part of the respiratory system, such as the lungs, nose, or throat. A respiratory infection that is caused by a virus is called a viral respiratory infection. Common types of viral respiratory infections include:  A cold.  The flu (influenza).  A respiratory syncytial virus (RSV) infection. What are the causes? This condition is caused by a virus. What are the signs or symptoms? Symptoms of this condition include:  A stuffy or runny nose.  Yellow or green nasal discharge.  A cough.  Sneezing.  Fatigue.  Achy muscles.  A sore throat.  Sweating or chills.  A fever.  A headache. How is this diagnosed? This condition may be diagnosed based on:  Your symptoms.  A physical exam.  Testing of nasal swabs. How is this treated? This condition may be treated with medicines, such as:  Antiviral medicine. This may shorten the length of time a person has symptoms.  Expectorants. These make it easier to cough up mucus.  Decongestant nasal sprays.  Acetaminophen or NSAIDs to relieve fever and pain. Antibiotic medicines are not prescribed for viral  infections. This is because antibiotics are designed to kill bacteria. They are not effective against viruses. Follow these instructions at home:  Managing pain and congestion  Take over-the-counter and prescription medicines only as told by your health care provider.  If you have a sore throat, gargle with a salt-water mixture 3-4 times a day or as needed. To make a salt-water mixture, completely dissolve -1 tsp of salt in 1 cup of warm water.  Use nose drops made from salt water to ease congestion and soften raw skin around your nose.  Drink enough fluid to keep your urine pale yellow. This helps prevent dehydration and helps loosen up mucus. General instructions  Rest as much as possible.  Do not drink alcohol.  Do not use any products that contain nicotine or tobacco, such as cigarettes and e-cigarettes. If  you need help quitting, ask your health care provider.  Keep all follow-up visits as told by your health care provider. This is important. How is this prevented?   Get an annual flu shot. You may get the flu shot in late summer, fall, or winter. Ask your health care provider when you should get your flu shot.  Avoid exposing others to your respiratory infection. ? Stay home from work or school as told by your health care provider. ? Wash your hands with soap and water often, especially after you cough or sneeze. If soap and water are not available, use alcohol-based hand sanitizer.  Avoid contact with people who are sick during cold and flu season. This is generally fall and winter. Contact a health care provider if:  Your symptoms last for 10 days or longer.  Your symptoms get worse over time.  You have a fever.  You have severe sinus pain in your face or forehead.  The glands in your jaw or neck become very swollen. Get help right away if you:  Feel pain or pressure in your chest.  Have shortness of breath.  Faint or feel like you will faint.  Have severe  and persistent vomiting.  Feel confused or disoriented. Summary  A respiratory infection is an illness that affects part of the respiratory system, such as the lungs, nose, or throat. A respiratory infection that is caused by a virus is called a viral respiratory infection.  Common types of viral respiratory infections are a cold, influenza, and respiratory syncytial virus (RSV) infection.  Symptoms of this condition include a stuffy or runny nose, cough, sneezing, fatigue, achy muscles, sore throat, and fevers or chills.  Antibiotic medicines are not prescribed for viral infections. This is because antibiotics are designed to kill bacteria. They are not effective against viruses. This information is not intended to replace advice given to you by your health care provider. Make sure you discuss any questions you have with your health care provider. Document Released: 08/14/2005 Document Revised: 12/15/2017 Document Reviewed: 12/15/2017 Elsevier Interactive Patient Education  2019 [employer].       Visit summary with medication list and pertinent instructions was printed for patient to review. All questions at time of visit were answered - patient instructed to contact office with any additional concerns or updates. ER/RTC precautions were reviewed with the patient.   Follow-up plan: Return in about 2 weeks (around 12/31/2018), or sooner if symptoms worsen or fail to improve, for A1C / diabetes follow-up .   Please note: voice recognition software was used to produce this document, and typos may escape review. Please contact Dr. Lyn Hollingshead for any needed clarifications.

## 2018-12-21 DIAGNOSIS — M9901 Segmental and somatic dysfunction of cervical region: Secondary | ICD-10-CM | POA: Diagnosis not present

## 2018-12-21 DIAGNOSIS — M9903 Segmental and somatic dysfunction of lumbar region: Secondary | ICD-10-CM | POA: Diagnosis not present

## 2018-12-21 DIAGNOSIS — M50322 Other cervical disc degeneration at C5-C6 level: Secondary | ICD-10-CM | POA: Diagnosis not present

## 2018-12-21 DIAGNOSIS — M9902 Segmental and somatic dysfunction of thoracic region: Secondary | ICD-10-CM | POA: Diagnosis not present

## 2018-12-23 DIAGNOSIS — F4322 Adjustment disorder with anxiety: Secondary | ICD-10-CM | POA: Diagnosis not present

## 2018-12-23 NOTE — Telephone Encounter (Signed)
CaseId:[id number];Status:Approved;Review Type:Prior Auth;Coverage Start Date:11/23/2018;Coverage End Date:12/23/2019;  Pharmacy aware.

## 2018-12-24 ENCOUNTER — Telehealth: Payer: Self-pay

## 2018-12-24 NOTE — Telephone Encounter (Signed)
Fax was sent to Pharmacy for approval per previous note. Patient has been notified as well. No further questions.

## 2018-12-24 NOTE — Telephone Encounter (Signed)
Pt called requesting a status update for Saxenda. As per pt, she called in the past week stating rx requires PA. Pt requesting a call back with an update. Thanks.

## 2018-12-27 ENCOUNTER — Other Ambulatory Visit: Payer: Self-pay | Admitting: Osteopathic Medicine

## 2018-12-27 DIAGNOSIS — E119 Type 2 diabetes mellitus without complications: Secondary | ICD-10-CM

## 2018-12-28 DIAGNOSIS — M9901 Segmental and somatic dysfunction of cervical region: Secondary | ICD-10-CM | POA: Diagnosis not present

## 2018-12-28 DIAGNOSIS — M9902 Segmental and somatic dysfunction of thoracic region: Secondary | ICD-10-CM | POA: Diagnosis not present

## 2018-12-28 DIAGNOSIS — M50322 Other cervical disc degeneration at C5-C6 level: Secondary | ICD-10-CM | POA: Diagnosis not present

## 2018-12-28 DIAGNOSIS — M9903 Segmental and somatic dysfunction of lumbar region: Secondary | ICD-10-CM | POA: Diagnosis not present

## 2019-01-04 DIAGNOSIS — M9901 Segmental and somatic dysfunction of cervical region: Secondary | ICD-10-CM | POA: Diagnosis not present

## 2019-01-04 DIAGNOSIS — M722 Plantar fascial fibromatosis: Secondary | ICD-10-CM | POA: Diagnosis not present

## 2019-01-04 DIAGNOSIS — M50322 Other cervical disc degeneration at C5-C6 level: Secondary | ICD-10-CM | POA: Diagnosis not present

## 2019-01-04 DIAGNOSIS — S92355G Nondisplaced fracture of fifth metatarsal bone, left foot, subsequent encounter for fracture with delayed healing: Secondary | ICD-10-CM | POA: Diagnosis not present

## 2019-01-04 DIAGNOSIS — M9903 Segmental and somatic dysfunction of lumbar region: Secondary | ICD-10-CM | POA: Diagnosis not present

## 2019-01-04 DIAGNOSIS — M25572 Pain in left ankle and joints of left foot: Secondary | ICD-10-CM | POA: Diagnosis not present

## 2019-01-04 DIAGNOSIS — M9902 Segmental and somatic dysfunction of thoracic region: Secondary | ICD-10-CM | POA: Diagnosis not present

## 2019-01-06 ENCOUNTER — Ambulatory Visit (INDEPENDENT_AMBULATORY_CARE_PROVIDER_SITE_OTHER): Payer: BLUE CROSS/BLUE SHIELD | Admitting: Osteopathic Medicine

## 2019-01-06 ENCOUNTER — Encounter: Payer: Self-pay | Admitting: Osteopathic Medicine

## 2019-01-06 VITALS — BP 145/97 | HR 82 | Temp 98.3°F | Wt 257.0 lb

## 2019-01-06 DIAGNOSIS — F4322 Adjustment disorder with anxiety: Secondary | ICD-10-CM | POA: Diagnosis not present

## 2019-01-06 DIAGNOSIS — J0110 Acute frontal sinusitis, unspecified: Secondary | ICD-10-CM

## 2019-01-06 MED ORDER — FLUCONAZOLE 150 MG PO TABS: 150.0000 mg | ORAL_TABLET | Freq: Once | ORAL | 1 refills | Status: AC

## 2019-01-06 MED ORDER — AMOXICILLIN-POT CLAVULANATE 875-125 MG PO TABS: 1.0000 | ORAL_TABLET | Freq: Two times a day (BID) | ORAL | 0 refills | Status: DC

## 2019-01-06 MED ORDER — IPRATROPIUM BROMIDE 0.06 % NA SOLN: 2.0000 | Freq: Four times a day (QID) | NASAL | 1 refills | Status: DC

## 2019-01-06 NOTE — Patient Instructions (Signed)
Sinusitis, Adult  Sinusitis is inflammation of your sinuses. Sinuses are hollow spaces in the bones around your face. Your sinuses are located:   Around your eyes.   In the middle of your forehead.   Behind your nose.   In your cheekbones.  Mucus normally drains out of your sinuses. When your nasal tissues become inflamed or swollen, mucus can become trapped or blocked. This allows bacteria, viruses, and fungi to grow, which leads to infection. Most infections of the sinuses are caused by a virus.  Sinusitis can develop quickly. It can last for up to 4 weeks (acute) or for more than 12 weeks (chronic). Sinusitis often develops after a cold.  What are the causes?  This condition is caused by anything that creates swelling in the sinuses or stops mucus from draining. This includes:   Allergies.   Asthma.   Infection from bacteria or viruses.   Deformities or blockages in your nose or sinuses.   Abnormal growths in the nose (nasal polyps).   Pollutants, such as chemicals or irritants in the air.   Infection from fungi (rare).  What increases the risk?  You are more likely to develop this condition if you:   Have a weak body defense system (immune system).   Do a lot of swimming or diving.   Overuse nasal sprays.   Smoke.  What are the signs or symptoms?  The main symptoms of this condition are pain and a feeling of pressure around the affected sinuses. Other symptoms include:   Stuffy nose or congestion.   Thick drainage from your nose.   Swelling and warmth over the affected sinuses.   Headache.   Upper toothache.   A cough that may get worse at night.   Extra mucus that collects in the throat or the back of the nose (postnasal drip).   Decreased sense of smell and taste.   Fatigue.   A fever.   Sore throat.   Bad breath.  How is this diagnosed?  This condition is diagnosed based on:   Your symptoms.   Your medical history.   A physical exam.   Tests to find out if your condition is  acute or chronic. This may include:  ? Checking your nose for nasal polyps.  ? Viewing your sinuses using a device that has a light (endoscope).  ? Testing for allergies or bacteria.  ? Imaging tests, such as an MRI or CT scan.  In rare cases, a bone biopsy may be done to rule out more serious types of fungal sinus disease.  How is this treated?  Treatment for sinusitis depends on the cause and whether your condition is chronic or acute.   If caused by a virus, your symptoms should go away on their own within 10 days. You may be given medicines to relieve symptoms. They include:  ? Medicines that shrink swollen nasal passages (topical intranasal decongestants).  ? Medicines that treat allergies (antihistamines).  ? A spray that eases inflammation of the nostrils (topical intranasal corticosteroids).  ? Rinses that help get rid of thick mucus in your nose (nasal saline washes).   If caused by bacteria, your health care provider may recommend waiting to see if your symptoms improve. Most bacterial infections will get better without antibiotic medicine. You may be given antibiotics if you have:  ? A severe infection.  ? A weak immune system.   If caused by narrow nasal passages or nasal polyps, you may need   to have surgery.  Follow these instructions at home:  Medicines   Take, use, or apply over-the-counter and prescription medicines only as told by your health care provider. These may include nasal sprays.   If you were prescribed an antibiotic medicine, take it as told by your health care provider. Do not stop taking the antibiotic even if you start to feel better.  Hydrate and humidify     Drink enough fluid to keep your urine pale yellow. Staying hydrated will help to thin your mucus.   Use a cool mist humidifier to keep the humidity level in your home above 50%.   Inhale steam for 10-15 minutes, 3-4 times a day, or as told by your health care provider. You can do this in the bathroom while a hot shower is  running.   Limit your exposure to cool or dry air.  Rest   Rest as much as possible.   Sleep with your head raised (elevated).   Make sure you get enough sleep each night.  General instructions     Apply a warm, moist washcloth to your face 3-4 times a day or as told by your health care provider. This will help with discomfort.   Wash your hands often with soap and water to reduce your exposure to germs. If soap and water are not available, use hand sanitizer.   Do not smoke. Avoid being around people who are smoking (secondhand smoke).   Keep all follow-up visits as told by your health care provider. This is important.  Contact a health care provider if:   You have a fever.   Your symptoms get worse.   Your symptoms do not improve within 10 days.  Get help right away if:   You have a severe headache.   You have persistent vomiting.   You have severe pain or swelling around your face or eyes.   You have vision problems.   You develop confusion.   Your neck is stiff.   You have trouble breathing.  Summary   Sinusitis is soreness and inflammation of your sinuses. Sinuses are hollow spaces in the bones around your face.   This condition is caused by nasal tissues that become inflamed or swollen. The swelling traps or blocks the flow of mucus. This allows bacteria, viruses, and fungi to grow, which leads to infection.   If you were prescribed an antibiotic medicine, take it as told by your health care provider. Do not stop taking the antibiotic even if you start to feel better.   Keep all follow-up visits as told by your health care provider. This is important.  This information is not intended to replace advice given to you by your health care provider. Make sure you discuss any questions you have with your health care provider.  Document Released: 11/04/2005 Document Revised: 04/06/2018 Document Reviewed: 04/06/2018  Elsevier Interactive Patient Education  2019 [employer]vier Inc[employer]

## 2019-01-06 NOTE — Progress Notes (Signed)
HPI: Bridget Lawrence is a 48 y.o. female who  has a past medical history of Diabetes (HCC) and High blood pressure.  she presents to [facility] today, 01/06/19,  for chief complaint of: SICK - concern for sinus problem   Recently seen for cough, almost 3 weeks ago.  States sure worsening over the past week or so, seems to be localizing in forehead/above eyes.  Sudafed has been causing blood pressure increased so she has stopped taking this.  Nasonex and other over-the-counter medicines do not seem to be helpful.   Past medical, surgical, social and family history reviewed and updated as necessary.   Current medication list and allergy/intolerance information reviewed:    Current Outpatient Medications  Medication Sig Dispense Refill  . AMBULATORY NON FORMULARY MEDICATION Medication Name: nebulizer tubing and mouthpiece per patient preference and insurance coverage 1 Units prn  . benzonatate (TESSALON) 200 MG capsule Take 1 capsule (200 mg total) by mouth 3 (three) times daily as needed for cough. 30 capsule 0  . Cholecalciferol (VITAMIN D) 2000 units CAPS Take by mouth.    . ciclopirox (PENLAC) 8 % solution APPLY DAILY ON NAIIL AND SURROUNDING SKIN OVER PREVIOUS COAT. AFTER 7 DAYS REMOVE WITH ALCOHOL AND REPEAT CYCLE    . fluticasone (FLONASE) 50 MCG/ACT nasal spray One spray in each nostril twice a day, use left hand for right nostril, and right hand for left nostril. 48 g 3  . glucose blood (ONE TOUCH ULTRA TEST) test strip Use to check blood glucose daily and as needed    . Glutamic Acid HCl (L-GLUTAMIC ACID PO) Take by mouth.    Marland Kitchen HYDROcodone-acetaminophen (NORCO/VICODIN) 5-325 MG tablet Take by mouth.    . Insulin Pen Needle (NOVOFINE) 32G X 6 MM MISC Use subcutaneously daily with Liraglutide 90 each 1  . Insulin Pen Needle (NOVOFINE) 32G X 6 MM MISC INJECT UNDER THE SKIN DAILY WITH LIRAGLUTIDE 100 each 0  . Insulin Pen Needle (NOVOFINE) 32G X 6 MM  MISC INJECT UNDER THE SKIN DAILY WITH LIRAGLUTIDE 100 each 1  . ipratropium-albuterol (DUONEB) 0.5-2.5 (3) MG/3ML SOLN Inhale 3 mLs into the lungs every 4 (four) hours as needed. 360 mL 3  . Liraglutide -Weight Management (SAXENDA) 18 MG/3ML SOPN Inject 3 mg into the skin daily. 45 mL 3  . meloxicam (MOBIC) 15 MG tablet TK 1 T PO QD    . metFORMIN (GLUCOPHAGE) 500 MG tablet Take 2 tablets (1,000 mg total) by mouth 2 (two) times daily with a meal. 360 tablet 3  . metFORMIN (GLUCOPHAGE-XR) 500 MG 24 hr tablet     . Methylcobalamin (METHYL B-12) 1000 MCG LOZG Take by mouth.    . olmesartan-hydrochlorothiazide (BENICAR HCT) 20-12.5 MG tablet Take 1 tablet by mouth daily. 90 tablet 3  . Probiotic Product (PROBIOTIC-10) CAPS Take by mouth.    . thiamine (VITAMIN B-1) 50 MG tablet Take by mouth.    Marland Kitchen VITAMIN K PO Take by mouth.    . Zinc 100 MG TABS Take by mouth.    . Zinc Sulfate (ZINC 15 PO) Take by mouth.     No current facility-administered medications for this visit.     Allergies  Allergen Reactions  . Doxycycline Other (See Comments)    Pt states," I don't remember.'    . Gabapentin Other (See Comments)    Slurred speech.         Review of Systems:  Constitutional:  No  fever,  no chills, +recent illness, No unintentional weight changes. No significant fatigue.   HEENT: +headache, no vision change, no hearing change, No sore throat, +sinus pressure  Cardiac: No  chest pain, No  pressure, No palpitations  Respiratory:  No  shortness of breath. No  Cough  Gastrointestinal: No  abdominal pain, No  nausea  Musculoskeletal: No new myalgia/arthralgia  Skin: No  Rash  Neurologic: No  weakness, No  dizziness  Exam:  BP (!) 145/97 (BP Location: Left Arm, Patient Position: Sitting, Cuff Size: Normal)   Pulse 82   Temp 98.3 F (36.8 C) (Oral)   Wt 257 lb (116.6 kg)   SpO2 100%   BMI 40.25 kg/m   Constitutional: VS see above. General Appearance: alert, well-developed,  well-nourished, NAD  Eyes: Normal lids and conjunctive, non-icteric sclera  Ears, Nose, Mouth, Throat: MMM, Normal external inspection ears/nares/mouth/lips/gums. TM normal bilaterally. Pharynx/tonsils no erythema, no exudate. Nasal mucosa normal.   Neck: No masses, trachea midline. No tenderness/mass appreciated. No lymphadenopathy  Respiratory: Normal respiratory effort. no wheeze, no rhonchi, no rales  Cardiovascular: S1/S2 normal, no murmur, no rub/gallop auscultated. RRR. No lower extremity edema.   Gastrointestinal: Nontender, no masses. Bowel sounds normal.  Musculoskeletal: Gait normal.   Neurological: Normal balance/coordination. No tremor.   Skin: warm, dry, intact.   Psychiatric: Normal judgment/insight. Normal mood and affect.     ASSESSMENT/PLAN: The encounter diagnosis was Acute frontal sinusitis, recurrence not specified.   Meds ordered this encounter  Medications  . amoxicillin-clavulanate (AUGMENTIN) 875-125 MG tablet    Sig: Take 1 tablet by mouth 2 (two) times daily.    Dispense:  14 tablet    Refill:  0  . ipratropium (ATROVENT) 0.06 % nasal spray    Sig: Place 2 sprays into both nostrils 4 (four) times daily.    Dispense:  15 mL    Refill:  1        Visit summary with medication list and pertinent instructions was printed for patient to review. All questions at time of visit were answered - patient instructed to contact office with any additional concerns or updates. ER/RTC precautions were reviewed with the patient.   Follow-up plan: Return for keep currently scheduled appointment for diabetes follow-up! See me sooner if needed. .   Please note: voice recognition software was used to produce this document, and typos may escape review. Please contact Dr. Lyn Hollingshead for any needed clarifications.

## 2019-01-06 NOTE — Addendum Note (Signed)
Addended by: Deirdre Pippins on: 01/06/2019 04:04 PM   Modules accepted: Orders

## 2019-01-12 ENCOUNTER — Ambulatory Visit: Payer: BLUE CROSS/BLUE SHIELD | Admitting: Osteopathic Medicine

## 2019-01-13 ENCOUNTER — Ambulatory Visit: Payer: BLUE CROSS/BLUE SHIELD | Admitting: Osteopathic Medicine

## 2019-01-14 DIAGNOSIS — R413 Other amnesia: Secondary | ICD-10-CM | POA: Diagnosis not present

## 2019-01-14 DIAGNOSIS — Z7984 Long term (current) use of oral hypoglycemic drugs: Secondary | ICD-10-CM | POA: Diagnosis not present

## 2019-01-14 DIAGNOSIS — E119 Type 2 diabetes mellitus without complications: Secondary | ICD-10-CM | POA: Diagnosis not present

## 2019-01-14 DIAGNOSIS — Z881 Allergy status to other antibiotic agents status: Secondary | ICD-10-CM | POA: Diagnosis not present

## 2019-01-14 DIAGNOSIS — I10 Essential (primary) hypertension: Secondary | ICD-10-CM | POA: Diagnosis not present

## 2019-01-14 DIAGNOSIS — Z888 Allergy status to other drugs, medicaments and biological substances status: Secondary | ICD-10-CM | POA: Diagnosis not present

## 2019-01-20 DIAGNOSIS — F4322 Adjustment disorder with anxiety: Secondary | ICD-10-CM | POA: Diagnosis not present

## 2019-01-26 DIAGNOSIS — S92355G Nondisplaced fracture of fifth metatarsal bone, left foot, subsequent encounter for fracture with delayed healing: Secondary | ICD-10-CM | POA: Diagnosis not present

## 2019-01-26 DIAGNOSIS — S92525G Nondisplaced fracture of medial phalanx of left lesser toe(s), subsequent encounter for fracture with delayed healing: Secondary | ICD-10-CM | POA: Diagnosis not present

## 2019-01-27 DIAGNOSIS — M50322 Other cervical disc degeneration at C5-C6 level: Secondary | ICD-10-CM | POA: Diagnosis not present

## 2019-01-27 DIAGNOSIS — M9903 Segmental and somatic dysfunction of lumbar region: Secondary | ICD-10-CM | POA: Diagnosis not present

## 2019-01-27 DIAGNOSIS — M9902 Segmental and somatic dysfunction of thoracic region: Secondary | ICD-10-CM | POA: Diagnosis not present

## 2019-01-27 DIAGNOSIS — M9901 Segmental and somatic dysfunction of cervical region: Secondary | ICD-10-CM | POA: Diagnosis not present

## 2019-01-31 DIAGNOSIS — G4733 Obstructive sleep apnea (adult) (pediatric): Secondary | ICD-10-CM | POA: Diagnosis not present

## 2019-02-01 ENCOUNTER — Telehealth: Payer: Self-pay

## 2019-02-01 NOTE — Telephone Encounter (Signed)
As per [facility] Rx was never rec'd. Pt will not be able to get nebulizer machine and supplies at pharmacy. Must go to a specialty pharmacy. Pls advise, thanks.

## 2019-02-01 NOTE — Telephone Encounter (Signed)
These Rx were sent to [facility] Can we confirm w/ her pharmacy?

## 2019-02-01 NOTE — Telephone Encounter (Signed)
Pt called requesting an update on Nebulizer machine with supplies. She stated that she been waiting since the ending of January to get the machine with supplies. Pls advise, thanks.

## 2019-02-02 MED ORDER — IPRATROPIUM-ALBUTEROL 0.5-2.5 (3) MG/3ML IN SOLN: 3.0000 mL | RESPIRATORY_TRACT | 3 refills | Status: DC | PRN

## 2019-02-02 MED ORDER — AMBULATORY NON FORMULARY MEDICATION: 99 refills | Status: DC

## 2019-02-02 NOTE — Telephone Encounter (Signed)
Contacted [employer] supply in [place] at [phone]. Rx faxed to [phone], confirmation rec'd.

## 2019-02-02 NOTE — Telephone Encounter (Signed)
[facility] Bridget Lawrence - phone [phone], Can we call to get their fax number?  Printed Rx for neb machine in [employer], can fax to med supply store above.  Nebulized medication e-rx sent to pharmacy on file

## 2019-02-04 ENCOUNTER — Other Ambulatory Visit: Payer: Self-pay

## 2019-02-04 MED ORDER — METFORMIN HCL 500 MG PO TABS: 1000.0000 mg | ORAL_TABLET | Freq: Two times a day (BID) | ORAL | 1 refills | Status: DC

## 2019-02-05 NOTE — Telephone Encounter (Signed)
Left vm msg for pt on 02/03/19 with an update regarding nebulizer machine/supplies. Direct call back info provided.

## 2019-02-10 ENCOUNTER — Other Ambulatory Visit: Payer: Self-pay

## 2019-02-10 ENCOUNTER — Ambulatory Visit (INDEPENDENT_AMBULATORY_CARE_PROVIDER_SITE_OTHER): Payer: Self-pay

## 2019-02-10 ENCOUNTER — Ambulatory Visit (INDEPENDENT_AMBULATORY_CARE_PROVIDER_SITE_OTHER): Payer: BLUE CROSS/BLUE SHIELD | Admitting: Family Medicine

## 2019-02-10 ENCOUNTER — Ambulatory Visit: Payer: BLUE CROSS/BLUE SHIELD | Admitting: Osteopathic Medicine

## 2019-02-10 ENCOUNTER — Encounter: Payer: Self-pay | Admitting: Family Medicine

## 2019-02-10 VITALS — BP 134/80 | HR 97 | Temp 98.4°F | Wt 263.0 lb

## 2019-02-10 DIAGNOSIS — M542 Cervicalgia: Secondary | ICD-10-CM

## 2019-02-10 DIAGNOSIS — M25512 Pain in left shoulder: Secondary | ICD-10-CM

## 2019-02-10 DIAGNOSIS — M25532 Pain in left wrist: Secondary | ICD-10-CM

## 2019-02-10 DIAGNOSIS — F4322 Adjustment disorder with anxiety: Secondary | ICD-10-CM | POA: Diagnosis not present

## 2019-02-10 MED ORDER — CYCLOBENZAPRINE HCL 5 MG PO TABS: 5.0000 mg | ORAL_TABLET | Freq: Three times a day (TID) | ORAL | 0 refills | Status: DC | PRN
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
## 2019-02-10 NOTE — Patient Instructions (Addendum)
Thank you for coming in today.  Work on range of motion exercises for the shoulder and neck. Use a heating pad and TENS unit.  Continue tylenol or ibuprofen for pain as needed.  Try limited flexeril for muscle spasms. Mostly take at night.  Send me an update through mychart in 2 weeks or so or sooner if needed.   Look up home exercises for neck spasm and rotator cuff tendonitis.   Shoulder exercises:  Up to the front Up to the side External rotation Internal rotation  Try to do about 30 reps 2x daily if you can.

## 2019-02-16 DIAGNOSIS — B351 Tinea unguium: Secondary | ICD-10-CM | POA: Diagnosis not present

## 2019-02-16 DIAGNOSIS — S92355G Nondisplaced fracture of fifth metatarsal bone, left foot, subsequent encounter for fracture with delayed healing: Secondary | ICD-10-CM | POA: Diagnosis not present

## 2019-02-16 DIAGNOSIS — M65872 Other synovitis and tenosynovitis, left ankle and foot: Secondary | ICD-10-CM | POA: Diagnosis not present

## 2019-02-16 DIAGNOSIS — S92525G Nondisplaced fracture of medial phalanx of left lesser toe(s), subsequent encounter for fracture with delayed healing: Secondary | ICD-10-CM | POA: Diagnosis not present

## 2019-02-17 ENCOUNTER — Encounter: Payer: Self-pay | Admitting: Osteopathic Medicine

## 2019-02-17 DIAGNOSIS — M79672 Pain in left foot: Secondary | ICD-10-CM | POA: Insufficient documentation

## 2019-02-17 DIAGNOSIS — F4322 Adjustment disorder with anxiety: Secondary | ICD-10-CM | POA: Diagnosis not present

## 2019-02-22 ENCOUNTER — Telehealth: Payer: Self-pay

## 2019-02-22 MED ORDER — METFORMIN HCL ER 500 MG PO TB24: 1000.0000 mg | ORAL_TABLET | Freq: Two times a day (BID) | ORAL | 3 refills | Status: DC

## 2019-02-22 NOTE — Telephone Encounter (Signed)
Pt called stating that med refill sent for metformin is incorrect. As per pt, current med gives her diarrhea. She is requesting for rx to be cancelled and sent in as metformin xr. Med not currently listed as "extended release". [facility] is [facility]. Pls advise, thanks.

## 2019-02-22 NOTE — Telephone Encounter (Signed)
Fixed.

## 2019-02-23 DIAGNOSIS — G4733 Obstructive sleep apnea (adult) (pediatric): Secondary | ICD-10-CM | POA: Diagnosis not present

## 2019-02-23 MED ORDER — METFORMIN HCL ER 500 MG PO TB24: 1000.0000 mg | ORAL_TABLET | Freq: Two times a day (BID) | ORAL | 3 refills | Status: DC

## 2019-02-23 NOTE — Telephone Encounter (Signed)
Rx sent to wrong pharmacy. Tried calling [facility] to cancel previous rx. Pharmacy does not open until 9 am. Will attempt again to contact pharmacy later on this morning. Pls send rx to [facility]. Thanks.

## 2019-02-23 NOTE — Telephone Encounter (Signed)
Contacted [facility] - spoke with Pharmacist John, metformin rx sent on 02/22/19 has been cancelled. No other inquiries during call

## 2019-02-24 DIAGNOSIS — G4733 Obstructive sleep apnea (adult) (pediatric): Secondary | ICD-10-CM | POA: Diagnosis not present

## 2019-02-24 DIAGNOSIS — G4710 Hypersomnia, unspecified: Secondary | ICD-10-CM | POA: Diagnosis not present

## 2019-02-24 DIAGNOSIS — R0683 Snoring: Secondary | ICD-10-CM | POA: Diagnosis not present

## 2019-02-24 DIAGNOSIS — F4322 Adjustment disorder with anxiety: Secondary | ICD-10-CM | POA: Diagnosis not present

## 2019-02-24 DIAGNOSIS — Z9989 Dependence on other enabling machines and devices: Secondary | ICD-10-CM | POA: Diagnosis not present

## 2019-03-03 DIAGNOSIS — G4733 Obstructive sleep apnea (adult) (pediatric): Secondary | ICD-10-CM | POA: Diagnosis not present

## 2019-03-03 DIAGNOSIS — M65872 Other synovitis and tenosynovitis, left ankle and foot: Secondary | ICD-10-CM | POA: Diagnosis not present

## 2019-03-03 DIAGNOSIS — M792 Neuralgia and neuritis, unspecified: Secondary | ICD-10-CM | POA: Diagnosis not present

## 2019-03-10 DIAGNOSIS — F4322 Adjustment disorder with anxiety: Secondary | ICD-10-CM | POA: Diagnosis not present

## 2019-03-12 DIAGNOSIS — G5752 Tarsal tunnel syndrome, left lower limb: Secondary | ICD-10-CM | POA: Diagnosis not present

## 2019-03-12 DIAGNOSIS — M79672 Pain in left foot: Secondary | ICD-10-CM | POA: Diagnosis not present

## 2019-03-12 DIAGNOSIS — R208 Other disturbances of skin sensation: Secondary | ICD-10-CM | POA: Diagnosis not present

## 2019-03-12 DIAGNOSIS — R203 Hyperesthesia: Secondary | ICD-10-CM | POA: Diagnosis not present

## 2019-03-22 ENCOUNTER — Encounter (INDEPENDENT_AMBULATORY_CARE_PROVIDER_SITE_OTHER): Payer: BLUE CROSS/BLUE SHIELD | Admitting: Osteopathic Medicine

## 2019-03-22 ENCOUNTER — Encounter: Payer: Self-pay | Admitting: Osteopathic Medicine

## 2019-03-22 DIAGNOSIS — E119 Type 2 diabetes mellitus without complications: Secondary | ICD-10-CM | POA: Diagnosis not present

## 2019-03-23 MED ORDER — BLOOD GLUCOSE MONITOR KIT: PACK | 0 refills | Status: DC

## 2019-03-23 MED ORDER — LANCETS 30G MISC: 99 refills | Status: DC

## 2019-03-23 MED ORDER — GLUCOSE BLOOD VI STRP: ORAL_STRIP | 99 refills | Status: DC

## 2019-03-23 NOTE — Telephone Encounter (Signed)
5 mins spent  

## 2019-03-24 ENCOUNTER — Encounter: Payer: Self-pay | Admitting: Osteopathic Medicine

## 2019-03-24 DIAGNOSIS — F4322 Adjustment disorder with anxiety: Secondary | ICD-10-CM | POA: Diagnosis not present

## 2019-03-24 MED ORDER — DEXCOM G6 SENSOR MISC: 1.0000 | Freq: Two times a day (BID) | 4 refills | Status: DC

## 2019-03-24 MED ORDER — DEXCOM G6 RECEIVER DEVI: 1.0000 | Freq: Two times a day (BID) | 3 refills | Status: DC

## 2019-03-24 MED ORDER — DEXCOM G6 TRANSMITTER MISC: 1.0000 | Freq: Two times a day (BID) | 3 refills | Status: DC

## 2019-03-31 DIAGNOSIS — M79672 Pain in left foot: Secondary | ICD-10-CM | POA: Diagnosis not present

## 2019-03-31 DIAGNOSIS — M722 Plantar fascial fibromatosis: Secondary | ICD-10-CM | POA: Diagnosis not present

## 2019-04-02 DIAGNOSIS — G4733 Obstructive sleep apnea (adult) (pediatric): Secondary | ICD-10-CM | POA: Diagnosis not present

## 2019-04-07 ENCOUNTER — Telehealth: Payer: Self-pay

## 2019-04-07 DIAGNOSIS — E119 Type 2 diabetes mellitus without complications: Secondary | ICD-10-CM

## 2019-04-07 DIAGNOSIS — F4322 Adjustment disorder with anxiety: Secondary | ICD-10-CM | POA: Diagnosis not present

## 2019-04-07 DIAGNOSIS — I10 Essential (primary) hypertension: Secondary | ICD-10-CM

## 2019-04-07 NOTE — Telephone Encounter (Signed)
Contacted pt regarding HM for DM care. She is not comfortable to come into the facility. Requesting a virtual appt & a lab order to be sent to [facility]. As per pt, she will call us back with location/phone # to fax requisition. Pls place order.  Scheduler: Pls contact pt to schedule a virtual appt. Thanks!

## 2019-04-07 NOTE — Telephone Encounter (Signed)
Left message with information below and for patient to call us back to schedule a webex. °

## 2019-04-08 NOTE — Telephone Encounter (Signed)
Thanks. Orders are in for blood work, can go to lab at her convenience

## 2019-04-16 DIAGNOSIS — G4733 Obstructive sleep apnea (adult) (pediatric): Secondary | ICD-10-CM | POA: Diagnosis not present

## 2019-04-21 DIAGNOSIS — F4322 Adjustment disorder with anxiety: Secondary | ICD-10-CM | POA: Diagnosis not present

## 2019-04-22 ENCOUNTER — Telehealth: Payer: BC Managed Care – PPO | Admitting: Osteopathic Medicine

## 2019-04-23 ENCOUNTER — Telehealth (INDEPENDENT_AMBULATORY_CARE_PROVIDER_SITE_OTHER): Payer: BC Managed Care – PPO | Admitting: Osteopathic Medicine

## 2019-04-23 ENCOUNTER — Encounter: Payer: Self-pay | Admitting: Osteopathic Medicine

## 2019-04-23 VITALS — Wt 254.0 lb

## 2019-04-23 DIAGNOSIS — M791 Myalgia, unspecified site: Secondary | ICD-10-CM | POA: Diagnosis not present

## 2019-04-23 DIAGNOSIS — G4489 Other headache syndrome: Secondary | ICD-10-CM | POA: Diagnosis not present

## 2019-04-23 MED ORDER — SUMATRIPTAN SUCCINATE 50 MG PO TABS: 50.0000 mg | ORAL_TABLET | Freq: Once | ORAL | 0 refills | Status: DC

## 2019-04-23 NOTE — Progress Notes (Signed)
Called pt at 940 am, no answer. Left a vm msg.

## 2019-04-23 NOTE — Patient Instructions (Addendum)
To be honest, I am not sure what to make of the intermittent burning type sensation throughout just one side of the body.    Typically, a stroke or mini stroke will cause weakness symptoms or blacking out of the vision or speech changes, you are not experiencing any of these alarm symptoms.    Inflammatory disorders and certain pain syndromes will typically involve both sides of the body and go your symptoms seem to be doing.    Given the headache and the history of migraines, this could be an atypical migraine presentation especially if associated with sensitivity to light and sound.    I think it would be reasonable to try migraine medication as needed and see if this resolves symptoms.    Would also get blood work and MRI of the brain to out other serious problems.    If symptoms worsen or change, especially if over the weekend or after hours, please go to the emergency department/call 911.

## 2019-04-23 NOTE — Progress Notes (Signed)
Virtual Visit via Video (App used: Doximity) Note  I connected with      Bridget Lawrence on 04/23/19 at 10:53 AM (pt was not answering intake calls) by a telemedicine application and verified that I am speaking with the correct person using two identifiers.  Patient is at home I am working from home    I discussed the limitations of evaluation and management by telemedicine and the availability of in person appointments. The patient expressed understanding and agreed to proceed.  History of Present Illness: Bridget Lawrence is a 48 y.o. female who would like to discuss  Chief Complaint  Patient presents with  . Headache    as per pt, whole left side of body hurts - head to toes x 3 wks    Wakes up in AM w/ headache, from head to toes on the entire left side, feels like a burning/inflamed sensation, feels hot. Lasts almost the entire day. Aleve doesn't seem to help. Excedrin Migraine helped a bit. Happens maybe one day per week. Reports fatigued on days she has symptoms but no weakness. No dizziness, no vision changes, no fevers, decreased appetite but attributes this to the Saxenda changing her dose to AM rather than PM. Had light and sound sensitivity. History migraines in her 20's.      Observations/Objective: Wt 254 lb (115.2 kg)   BMI 39.78 kg/m  BP Readings from Last 3 Encounters:  02/10/19 134/80  01/06/19 (!) 145/97  12/17/18 122/82   Exam: Normal Speech.  NAD Symmetric movement of facial muscles and upper extremities, EOMI  Lab and Radiology Results No results found for this or any previous visit (from the past 72 hour(s)). No results found.     Assessment and Plan: 48 y.o. female with The primary encounter diagnosis was Other headache syndrome. A diagnosis of Myalgia was also pertinent to this visit.   PDMP not reviewed this encounter. Orders Placed This Encounter  Procedures  . MR Brain W Wo Contrast    Standing Status:   Future    Standing Expiration  Date:   06/22/2020    Order Specific Question:   If indicated for the ordered procedure, I authorize the administration of contrast media per Radiology protocol    Answer:   Yes    Order Specific Question:   What is the patient's sedation requirement?    Answer:   No Sedation    Order Specific Question:   Does the patient have a pacemaker or implanted devices?    Answer:   No    Order Specific Question:   Radiology Contrast Protocol - do NOT remove file path    Answer:   \\[email or web address]    Order Specific Question:   Preferred imaging location?    Answer:   Product/process development scientist (table limit-350lbs)  . CBC with Differential/Platelet  . COMPLETE METABOLIC PANEL WITH GFR  . Lipid panel  . TSH  . Hemoglobin A1c  . Magnesium  . Sedimentation rate  . High sensitivity CRP   Meds ordered this encounter  Medications  . SUMAtriptan (IMITREX) 50 MG tablet    Sig: Take 1 tablet (50 mg total) by mouth once for 1 dose. May repeat after 2 hours if headache persists or recurs.    Dispense:  10 tablet    Refill:  0   Patient Instructions  To be honest, I am not sure what to make of the intermittent burning type sensation throughout just one side of the body.  Typically, a stroke or mini stroke will cause weakness symptoms or blacking out of the vision or speech changes, you are not experiencing any of these alarm symptoms.    Inflammatory disorders and certain pain syndromes will typically involve both sides of the body and go your symptoms seem to be doing.    Given the headache and the history of migraines, this could be an atypical migraine presentation especially if associated with sensitivity to light and sound.    I think it would be reasonable to try migraine medication as needed and see if this resolves symptoms.    Would also get blood work and MRI of the brain to out other serious problems.    If symptoms worsen or change, especially if over the weekend  or after hours, please go to the emergency department/call 911.      Instructions sent via MyChart. If MyChart not available, pt was given option for info via personal e-mail w/ no guarantee of protected health info over unsecured e-mail communication, and MyChart sign-up instructions were included.   Follow Up Instructions: No follow-ups on file.    I discussed the assessment and treatment plan with the patient. The patient was provided an opportunity to ask questions and all were answered. The patient agreed with the plan and demonstrated an understanding of the instructions.   The patient was advised to call back or seek an in-person evaluation if any new concerns, if symptoms worsen or if the condition fails to improve as anticipated.  25 minutes of non-face-to-face time was provided during this encounter.                      Historical information moved to improve visibility of documentation.  Past Medical History:  Diagnosis Date  . Diabetes ([place])   . High blood pressure    Past Surgical History:  Procedure Laterality Date  . ANKLE SURGERY  11/19/2015   Social History   Tobacco Use  . Smoking status: Never Smoker  . Smokeless tobacco: Never Used  Substance Use Topics  . Alcohol use: Yes    Comment: 4/wk   family history includes Diabetes in her brother, mother, and sister; Heart attack in her mother and sister; High blood pressure in her brother, mother, and sister; Stroke in her maternal grandmother and mother.  Medications: Current Outpatient Medications  Medication Sig Dispense Refill  . AMBULATORY NON FORMULARY MEDICATION Medication Name: nebulizer machine, tubing and mouthpiece per patient preference and insurance coverage 1 Units prn  . blood glucose meter kit and supplies KIT CGM meter - Dx DM E11.9 Check fasting blood sugar every morning and 2 hours after largest meal of the day. 1 each 0  . Cholecalciferol (VITAMIN D) 2000 units CAPS Take  by mouth.    . ciclopirox (PENLAC) 8 % solution APPLY DAILY ON NAIIL AND SURROUNDING SKIN OVER PREVIOUS COAT. AFTER 7 DAYS REMOVE WITH ALCOHOL AND REPEAT CYCLE    . Continuous Blood Gluc Receiver (DEXCOM G6 RECEIVER) DEVI 1 each by Does not apply route 2 (two) times a day. Dx DM E11.9 Check fasting blood sugar every morning and 2 hours after largest meal of the day. 1 Device 3  . Continuous Blood Gluc Sensor (DEXCOM G6 SENSOR) MISC 1 each by Does not apply route 2 (two) times a day. Dx DM E11.9 Check fasting blood sugar every morning and 2 hours after largest meal of the day. 3 each 4  . Continuous Blood Gluc  Transmit (DEXCOM G6 TRANSMITTER) MISC 1 each by Does not apply route 2 (two) times daily. Dx DM E11.9 Check fasting blood sugar every morning and 2 hours after largest meal of the day. 1 each 3  . fluticasone (FLONASE) 50 MCG/ACT nasal spray One spray in each nostril twice a day, use left hand for right nostril, and right hand for left nostril. 48 g 3  . glucose blood (ACCU-CHEK AVIVA) test strip Dx DM E11.9 Check fasting blood sugar every morning and 2 hours after largest meal of the day. 100 each prn  . Glutamic Acid HCl (L-GLUTAMIC ACID PO) Take by mouth.    . Insulin Pen Needle (NOVOFINE) 32G X 6 MM MISC Use subcutaneously daily with Liraglutide 90 each 1  . Insulin Pen Needle (NOVOFINE) 32G X 6 MM MISC INJECT UNDER THE SKIN DAILY WITH LIRAGLUTIDE 100 each 0  . Insulin Pen Needle (NOVOFINE) 32G X 6 MM MISC INJECT UNDER THE SKIN DAILY WITH LIRAGLUTIDE 100 each 1  . ipratropium-albuterol (DUONEB) 0.5-2.5 (3) MG/3ML SOLN Inhale 3 mLs into the lungs every 4 (four) hours as needed. 360 mL 3  . Lancets 30G MISC Dx DM E11.9 Check fasting blood sugar every morning and 2 hours after largest meal of the day. 100 each prn  . metFORMIN (GLUCOPHAGE XR) 500 MG 24 hr tablet Take 2 tablets (1,000 mg total) by mouth 2 (two) times daily. 360 tablet 3  . olmesartan-hydrochlorothiazide (BENICAR HCT) 20-12.5 MG  tablet Take 1 tablet by mouth daily. 90 tablet 3  . Probiotic Product (PROBIOTIC-10) CAPS Take by mouth.    Marland Kitchen VITAMIN K PO Take by mouth.    . Zinc 100 MG TABS Take by mouth.    . Zinc Sulfate (ZINC 15 PO) Take by mouth.    Marland Kitchen amoxicillin-clavulanate (AUGMENTIN) 875-125 MG tablet Take 1 tablet by mouth 2 (two) times daily. (Patient not taking: Reported on 04/23/2019) 14 tablet 0  . benzonatate (TESSALON) 200 MG capsule Take 1 capsule (200 mg total) by mouth 3 (three) times daily as needed for cough. (Patient not taking: Reported on 04/23/2019) 30 capsule 0  . cyclobenzaprine (FLEXERIL) 5 MG tablet Take 1-2 tablets (5-10 mg total) by mouth 3 (three) times daily as needed for muscle spasms. (Patient not taking: Reported on 04/23/2019) 60 tablet 0  . HYDROcodone-acetaminophen (NORCO/VICODIN) 5-325 MG tablet Take by mouth.    Marland Kitchen ipratropium (ATROVENT) 0.06 % nasal spray Place 2 sprays into both nostrils 4 (four) times daily. (Patient not taking: Reported on 04/23/2019) 15 mL 1  . meloxicam (MOBIC) 15 MG tablet TK 1 T PO QD    . Methylcobalamin (METHYL B-12) 1000 MCG LOZG Take by mouth.    . SUMAtriptan (IMITREX) 50 MG tablet Take 1 tablet (50 mg total) by mouth once for 1 dose. May repeat after 2 hours if headache persists or recurs. 10 tablet 0  . thiamine (VITAMIN B-1) 50 MG tablet Take by mouth.     No current facility-administered medications for this visit.    Allergies  Allergen Reactions  . Doxycycline Other (See Comments)    Pt states," I don't remember.'    . Gabapentin Other (See Comments)    Slurred speech.       PDMP not reviewed this encounter. Orders Placed This Encounter  Procedures  . MR Brain W Wo Contrast    Standing Status:   Future    Standing Expiration Date:   06/22/2020    Order Specific Question:  If indicated for the ordered procedure, I authorize the administration of contrast media per Radiology protocol    Answer:   Yes    Order Specific Question:   What is the  patient's sedation requirement?    Answer:   No Sedation    Order Specific Question:   Does the patient have a pacemaker or implanted devices?    Answer:   No    Order Specific Question:   Radiology Contrast Protocol - do NOT remove file path    Answer:   \\[email or web address]    Order Specific Question:   Preferred imaging location?    Answer:   Product/process development scientist (table limit-350lbs)  . CBC with Differential/Platelet  . COMPLETE METABOLIC PANEL WITH GFR  . Lipid panel  . TSH  . Hemoglobin A1c  . Magnesium  . Sedimentation rate  . High sensitivity CRP   Meds ordered this encounter  Medications  . SUMAtriptan (IMITREX) 50 MG tablet    Sig: Take 1 tablet (50 mg total) by mouth once for 1 dose. May repeat after 2 hours if headache persists or recurs.    Dispense:  10 tablet    Refill:  0

## 2019-04-29 ENCOUNTER — Other Ambulatory Visit: Payer: Self-pay | Admitting: Osteopathic Medicine

## 2019-04-29 NOTE — Telephone Encounter (Signed)
Forwarding to PCP for review.  

## 2019-05-03 DIAGNOSIS — G4733 Obstructive sleep apnea (adult) (pediatric): Secondary | ICD-10-CM | POA: Diagnosis not present

## 2019-05-05 DIAGNOSIS — M79672 Pain in left foot: Secondary | ICD-10-CM | POA: Diagnosis not present

## 2019-05-05 DIAGNOSIS — F4322 Adjustment disorder with anxiety: Secondary | ICD-10-CM | POA: Diagnosis not present

## 2019-05-05 DIAGNOSIS — M79671 Pain in right foot: Secondary | ICD-10-CM | POA: Diagnosis not present

## 2019-05-19 DIAGNOSIS — F4322 Adjustment disorder with anxiety: Secondary | ICD-10-CM | POA: Diagnosis not present

## 2019-05-26 LAB — CBC WITH DIFFERENTIAL/PLATELET
Absolute Monocytes: 515 cells/uL (ref 200–950)
Basophils Absolute: 22 cells/uL (ref 0–200)
Basophils Relative: 0.5 %
Eosinophils Absolute: 92 cells/uL (ref 15–500)
Eosinophils Relative: 2.1 %
HCT: 35.5 % (ref 35.0–45.0)
Hemoglobin: 11.5 g/dL — ABNORMAL LOW (ref 11.7–15.5)
Lymphs Abs: 1624 cells/uL (ref 850–3900)
MCH: 28.6 pg (ref 27.0–33.0)
MCHC: 32.4 g/dL (ref 32.0–36.0)
MCV: 88.3 fL (ref 80.0–100.0)
MPV: 11.2 fL (ref 7.5–12.5)
Monocytes Relative: 11.7 %
Neutro Abs: 2147 cells/uL (ref 1500–7800)
Neutrophils Relative %: 48.8 %
Platelets: 289 10*3/uL (ref 140–400)
RBC: 4.02 10*6/uL (ref 3.80–5.10)
RDW: 12.4 % (ref 11.0–15.0)
Total Lymphocyte: 36.9 %
WBC: 4.4 10*3/uL (ref 3.8–10.8)

## 2019-05-26 LAB — LIPID PANEL
Cholesterol: 174 mg/dL (ref <200)
HDL: 45 mg/dL — ABNORMAL LOW (ref >=50)
LDL Cholesterol (Calc): 103 mg/dL (calc) — ABNORMAL HIGH
Non-HDL Cholesterol (Calc): 129 mg/dL (calc) (ref <130)
Total CHOL/HDL Ratio: 3.9 (calc) (ref <5.0)
Triglycerides: 165 mg/dL — ABNORMAL HIGH (ref <150)

## 2019-05-26 LAB — COMPLETE METABOLIC PANEL WITH GFR
AG Ratio: 1.4 (calc) (ref 1.0–2.5)
ALT: 87 U/L — ABNORMAL HIGH (ref 6–29)
AST: 62 U/L — ABNORMAL HIGH (ref 10–35)
Albumin: 4.2 g/dL (ref 3.6–5.1)
Alkaline phosphatase (APISO): 43 U/L (ref 37–153)
BUN: 12 mg/dL (ref 7–25)
CO2: 26 mmol/L (ref 20–32)
Calcium: 9.1 mg/dL (ref 8.6–10.4)
Chloride: 99 mmol/L (ref 98–110)
Creat: 0.81 mg/dL (ref 0.50–1.05)
GFR, Est African American: 98 mL/min/{1.73_m2} (ref >=60)
GFR, Est Non African American: 85 mL/min/{1.73_m2} (ref >=60)
Globulin: 3.1 g/dL (calc) (ref 1.9–3.7)
Glucose, Bld: 215 mg/dL — ABNORMAL HIGH (ref 65–139)
Potassium: 4.2 mmol/L (ref 3.5–5.3)
Sodium: 134 mmol/L — ABNORMAL LOW (ref 135–146)
Total Bilirubin: 0.4 mg/dL (ref 0.2–1.2)
Total Protein: 7.3 g/dL (ref 6.1–8.1)

## 2019-05-26 LAB — HIGH SENSITIVITY CRP: hs-CRP: >10 mg/L — ABNORMAL HIGH

## 2019-05-26 LAB — TSH: TSH: 1.45 mIU/L

## 2019-05-26 LAB — HEMOGLOBIN A1C
Hgb A1c MFr Bld: 8.1 % of total Hgb — ABNORMAL HIGH (ref <5.7)
Mean Plasma Glucose: 186 (calc)
eAG (mmol/L): 10.3 (calc)

## 2019-05-26 LAB — SEDIMENTATION RATE: Sed Rate: 11 mm/h (ref 0–20)

## 2019-05-26 LAB — MAGNESIUM: Magnesium: 1.6 mg/dL (ref 1.5–2.5)

## 2019-05-27 DIAGNOSIS — R197 Diarrhea, unspecified: Secondary | ICD-10-CM | POA: Diagnosis not present

## 2019-05-27 DIAGNOSIS — Z20828 Contact with and (suspected) exposure to other viral communicable diseases: Secondary | ICD-10-CM | POA: Diagnosis not present

## 2019-05-27 DIAGNOSIS — Z1159 Encounter for screening for other viral diseases: Secondary | ICD-10-CM | POA: Diagnosis not present

## 2019-05-31 ENCOUNTER — Ambulatory Visit (INDEPENDENT_AMBULATORY_CARE_PROVIDER_SITE_OTHER): Payer: BC Managed Care – PPO

## 2019-05-31 ENCOUNTER — Other Ambulatory Visit: Payer: Self-pay

## 2019-05-31 DIAGNOSIS — G4489 Other headache syndrome: Secondary | ICD-10-CM

## 2019-05-31 DIAGNOSIS — R51 Headache: Secondary | ICD-10-CM | POA: Diagnosis not present

## 2019-05-31 MED ORDER — GADOBUTROL 1 MMOL/ML IV SOLN
10.0000 mL | Freq: Once | INTRAVENOUS | Status: AC | PRN
  Administered 2019-05-31: 10 mL via INTRAVENOUS

## 2019-06-02 DIAGNOSIS — G4733 Obstructive sleep apnea (adult) (pediatric): Secondary | ICD-10-CM | POA: Diagnosis not present

## 2019-06-07 ENCOUNTER — Encounter: Payer: Self-pay | Admitting: Osteopathic Medicine

## 2019-06-07 ENCOUNTER — Other Ambulatory Visit: Payer: BC Managed Care – PPO

## 2019-06-07 ENCOUNTER — Telehealth (INDEPENDENT_AMBULATORY_CARE_PROVIDER_SITE_OTHER): Payer: BC Managed Care – PPO | Admitting: Osteopathic Medicine

## 2019-06-07 VITALS — BP 124/79 | Wt 251.9 lb

## 2019-06-07 DIAGNOSIS — G4489 Other headache syndrome: Secondary | ICD-10-CM

## 2019-06-07 DIAGNOSIS — I10 Essential (primary) hypertension: Secondary | ICD-10-CM

## 2019-06-07 DIAGNOSIS — R9089 Other abnormal findings on diagnostic imaging of central nervous system: Secondary | ICD-10-CM

## 2019-06-07 DIAGNOSIS — R7982 Elevated C-reactive protein (CRP): Secondary | ICD-10-CM

## 2019-06-07 DIAGNOSIS — R748 Abnormal levels of other serum enzymes: Secondary | ICD-10-CM

## 2019-06-07 DIAGNOSIS — M791 Myalgia, unspecified site: Secondary | ICD-10-CM | POA: Diagnosis not present

## 2019-06-07 DIAGNOSIS — E119 Type 2 diabetes mellitus without complications: Secondary | ICD-10-CM | POA: Diagnosis not present

## 2019-06-07 MED ORDER — RIZATRIPTAN BENZOATE 5 MG PO TABS: 5.0000 mg | ORAL_TABLET | ORAL | 0 refills | Status: DC | PRN

## 2019-06-07 NOTE — Progress Notes (Signed)
Virtual Visit via Video (App used: MyChart) Note  I reached out to      [name] on 06/07/19 at 10:45 AM  by a telemedicine application  No answer, pt not logged in to [employer] again via [place] video chat 10:52 AM    and verified that I am speaking with the correct person using two identifiers.  Patient is at home I am in office    I discussed the limitations of evaluation and management by telemedicine and the availability of in person appointments. The patient expressed understanding and agreed to proceed.  History of Present Illness: Bridget Lawrence is a 48 y.o. female who would like to discuss results    Spoke to patient about a month and a half ago, 04/23/2019, at that time was concerned about headache issues and persistent left-sided body pain.  It took a while but we ended up getting labs and MRI of the brain.  MRI was concerning for some nonspecific white matter abnormalities concerning for potential atypical migraine, chronic ischemia, demyelinating illness.   On labs, liver enzymes and A1c were also a bit elevated.  Patient reports imperfect adherence to diabetic diet and exercise, reports some weight gain.  Reports that the Imitrex was helpful but she had some nausea and feeling of jaw tightness with that medication, would like to try alternative.  Reports that the generalized left-sided pain has improved and essentially resolved over the last week or so.    Observations/Objective: BP 124/79 (Patient Position: Sitting, Cuff Size: Normal)   Wt 251 lb 14.4 oz (114.3 kg)   BMI 39.45 kg/m  BP Readings from Last 3 Encounters:  06/07/19 124/79  02/10/19 134/80  01/06/19 (!) 145/97   Exam: Normal Speech.  NAD  Lab and Radiology Results No results found for this or any previous visit (from the past 72 hour(s)). No results found.     Assessment and Plan: 48 y.o. female with The primary encounter diagnosis was Abnormal finding on MRI of brain.  Diagnoses of Other headache syndrome, Type 2 diabetes mellitus without complication, without long-term current use of insulin ([place]), Myalgia, Essential hypertension, Elevated liver enzymes, and Elevated C-reactive protein (CRP) were also pertinent to this visit.   PDMP not reviewed this encounter. Orders Placed This Encounter  Procedures  . CBC  . COMPLETE METABOLIC PANEL WITH GFR  . Lipid panel  . Hemoglobin A1c  . High sensitivity CRP  . Ambulatory referral to Neurology    Referral Priority:   Routine    Referral Type:   Consultation    Referral Reason:   Specialty Services Required    Requested Specialty:   Neurology    Number of Visits Requested:   1   Meds ordered this encounter  Medications  . rizatriptan (MAXALT) 5 MG tablet    Sig: Take 1-2 tablets (5-10 mg total) by mouth as needed for migraine. May repeat in 2 hours if needed    Dispense:  10 tablet    Refill:  0   Patient Instructions  Plan:  Due to side effects from Imitrex, will change this medication.  Continue to take as needed for migraine type headache.  Will refer to neurology for second opinion regarding symptoms and abnormal MRI results.  We will plan to recheck blood work in another 3 months for sugars, cholesterol, CRP.    Instructions sent via MyChart. If MyChart not available, pt was given option for info via personal e-mail w/ no guarantee  of protected health info over unsecured e-mail communication, and MyChart sign-up instructions were included.   Follow Up Instructions: Return for RECHECK PENDING NEUROLOGY RECOMMENDATIONS / IF WORSE OR CHANGE.    I discussed the assessment and treatment plan with the patient. The patient was provided an opportunity to ask questions and all were answered. The patient agreed with the plan and demonstrated an understanding of the instructions.   The patient was advised to call back or seek an in-person evaluation if any new concerns, if symptoms worsen or if the  condition fails to improve as anticipated.  30 minutes of non-face-to-face time was provided during this encounter.                      Historical information moved to improve visibility of documentation.  Past Medical History:  Diagnosis Date  . Diabetes ([place])   . High blood pressure    Past Surgical History:  Procedure Laterality Date  . ANKLE SURGERY  11/19/2015   Social History   Tobacco Use  . Smoking status: Never Smoker  . Smokeless tobacco: Never Used  Substance Use Topics  . Alcohol use: Yes    Comment: 4/wk   family history includes Diabetes in her brother, mother, and sister; Heart attack in her mother and sister; High blood pressure in her brother, mother, and sister; Stroke in her maternal grandmother and mother.  Medications: Current Outpatient Medications  Medication Sig Dispense Refill  . AMBULATORY NON FORMULARY MEDICATION Medication Name: nebulizer machine, tubing and mouthpiece per patient preference and insurance coverage 1 Units prn  . Cholecalciferol (VITAMIN D) 2000 units CAPS Take by mouth.    . ciclopirox (PENLAC) 8 % solution APPLY DAILY ON NAIIL AND SURROUNDING SKIN OVER PREVIOUS COAT. AFTER 7 DAYS REMOVE WITH ALCOHOL AND REPEAT CYCLE    . Continuous Blood Gluc Sensor (DEXCOM G6 SENSOR) MISC 1 each by Does not apply route 2 (two) times a day. Dx DM E11.9 Check fasting blood sugar every morning and 2 hours after largest meal of the day. 3 each 4  . fluticasone (FLONASE) 50 MCG/ACT nasal spray One spray in each nostril twice a day, use left hand for right nostril, and right hand for left nostril. 48 g 3  . glucose blood (ACCU-CHEK AVIVA) test strip Dx DM E11.9 Check fasting blood sugar every morning and 2 hours after largest meal of the day. 100 each prn  . Glutamic Acid HCl (L-GLUTAMIC ACID PO) Take by mouth.    . Insulin Pen Needle (NOVOFINE) 32G X 6 MM MISC INJECT UNDER THE SKIN DAILY WITH LIRAGLUTIDE 100 each 1  .  ipratropium-albuterol (DUONEB) 0.5-2.5 (3) MG/3ML SOLN Inhale 3 mLs into the lungs every 4 (four) hours as needed. 360 mL 3  . Lancets 30G MISC Dx DM E11.9 Check fasting blood sugar every morning and 2 hours after largest meal of the day. 100 each prn  . metFORMIN (GLUCOPHAGE XR) 500 MG 24 hr tablet Take 2 tablets (1,000 mg total) by mouth 2 (two) times daily. 360 tablet 3  . Methylcobalamin (METHYL B-12) 1000 MCG LOZG Take by mouth.    . olmesartan-hydrochlorothiazide (BENICAR HCT) 20-12.5 MG tablet TAKE 1 TABLET DAILY 90 tablet 3  . Probiotic Product (PROBIOTIC-10) CAPS Take by mouth.    . thiamine (VITAMIN B-1) 50 MG tablet Take by mouth.    Marland Kitchen VITAMIN K PO Take by mouth.    . Zinc 100 MG TABS Take by mouth.    Marland Kitchen  Zinc Sulfate (ZINC 15 PO) Take by mouth.    Marland Kitchen amoxicillin-clavulanate (AUGMENTIN) 875-125 MG tablet Take 1 tablet by mouth 2 (two) times daily. (Patient not taking: Reported on 04/23/2019) 14 tablet 0  . benzonatate (TESSALON) 200 MG capsule Take 1 capsule (200 mg total) by mouth 3 (three) times daily as needed for cough. (Patient not taking: Reported on 04/23/2019) 30 capsule 0  . blood glucose meter kit and supplies KIT CGM meter - Dx DM E11.9 Check fasting blood sugar every morning and 2 hours after largest meal of the day. (Patient not taking: Reported on 06/07/2019) 1 each 0  . Continuous Blood Gluc Receiver (DEXCOM G6 RECEIVER) DEVI 1 each by Does not apply route 2 (two) times a day. Dx DM E11.9 Check fasting blood sugar every morning and 2 hours after largest meal of the day. (Patient not taking: Reported on 06/07/2019) 1 Device 3  . Continuous Blood Gluc Transmit (DEXCOM G6 TRANSMITTER) MISC 1 each by Does not apply route 2 (two) times daily. Dx DM E11.9 Check fasting blood sugar every morning and 2 hours after largest meal of the day. (Patient not taking: Reported on 06/07/2019) 1 each 3  . cyclobenzaprine (FLEXERIL) 5 MG tablet Take 1-2 tablets (5-10 mg total) by mouth 3 (three) times  daily as needed for muscle spasms. (Patient not taking: Reported on 04/23/2019) 60 tablet 0  . HYDROcodone-acetaminophen (NORCO/VICODIN) 5-325 MG tablet Take by mouth.    . Insulin Pen Needle (NOVOFINE) 32G X 6 MM MISC Use subcutaneously daily with Liraglutide (Patient not taking: Reported on 06/07/2019) 90 each 1  . Insulin Pen Needle (NOVOFINE) 32G X 6 MM MISC INJECT UNDER THE SKIN DAILY WITH LIRAGLUTIDE (Patient not taking: Reported on 06/07/2019) 100 each 0  . ipratropium (ATROVENT) 0.06 % nasal spray Place 2 sprays into both nostrils 4 (four) times daily. (Patient not taking: Reported on 04/23/2019) 15 mL 1  . meloxicam (MOBIC) 15 MG tablet TK 1 T PO QD    . SUMAtriptan (IMITREX) 50 MG tablet Take 1 tablet (50 mg total) by mouth once for 1 dose. May repeat after 2 hours if headache persists or recurs. 10 tablet 0   No current facility-administered medications for this visit.    Allergies  Allergen Reactions  . Doxycycline Other (See Comments)    Pt states," I don't remember.'    . Gabapentin Other (See Comments)    Slurred speech.       PDMP not reviewed this encounter. Orders Placed This Encounter  Procedures  . CBC  . COMPLETE METABOLIC PANEL WITH GFR  . Lipid panel  . Hemoglobin A1c  . High sensitivity CRP  . Ambulatory referral to Neurology    Referral Priority:   Routine    Referral Type:   Consultation    Referral Reason:   Specialty Services Required    Requested Specialty:   Neurology    Number of Visits Requested:   1   No orders of the defined types were placed in this encounter.

## 2019-06-07 NOTE — Patient Instructions (Addendum)
Plan:  Due to side effects from Imitrex, will change this medication.  Continue to take as needed for migraine type headache.  Will refer to neurology for second opinion regarding symptoms and abnormal MRI results.  We will plan to recheck blood work in another 3 months for sugars, cholesterol, CRP.

## 2019-06-10 ENCOUNTER — Ambulatory Visit: Payer: BC Managed Care – PPO | Admitting: Rehabilitative and Restorative Service Providers"

## 2019-06-10 DIAGNOSIS — M9901 Segmental and somatic dysfunction of cervical region: Secondary | ICD-10-CM | POA: Diagnosis not present

## 2019-06-10 DIAGNOSIS — M9903 Segmental and somatic dysfunction of lumbar region: Secondary | ICD-10-CM | POA: Diagnosis not present

## 2019-06-10 DIAGNOSIS — M50322 Other cervical disc degeneration at C5-C6 level: Secondary | ICD-10-CM | POA: Diagnosis not present

## 2019-06-10 DIAGNOSIS — M9902 Segmental and somatic dysfunction of thoracic region: Secondary | ICD-10-CM | POA: Diagnosis not present

## 2019-06-15 ENCOUNTER — Ambulatory Visit (INDEPENDENT_AMBULATORY_CARE_PROVIDER_SITE_OTHER): Payer: BC Managed Care – PPO | Admitting: Neurology

## 2019-06-15 ENCOUNTER — Other Ambulatory Visit: Payer: Self-pay

## 2019-06-15 ENCOUNTER — Encounter: Payer: Self-pay | Admitting: Neurology

## 2019-06-15 DIAGNOSIS — G43809 Other migraine, not intractable, without status migrainosus: Secondary | ICD-10-CM

## 2019-06-15 HISTORY — DX: Other migraine, not intractable, without status migrainosus: G43.809

## 2019-06-15 MED ORDER — DICLOFENAC POTASSIUM 50 MG PO TABS: 50.0000 mg | ORAL_TABLET | Freq: Three times a day (TID) | ORAL | 2 refills | Status: DC | PRN

## 2019-06-15 MED ORDER — TOPIRAMATE 25 MG PO TABS: ORAL_TABLET | ORAL | 3 refills | Status: DC

## 2019-06-15 NOTE — Progress Notes (Signed)
Reason for visit: Migraine headache  Referring physician: Dr. Rodrigo Ran is a 48 y.o. female  History of present illness:  Ms. Borak is a 48 year old left-handed black female with a prior history of migraine headaches when she was in her teens through age 62.  At that point, her headaches disappeared.  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  She claims that her sister and a niece also have migraine headache.  The patient has been doing quite well until around 05 May 2019.  The patient woke up with a left-sided headache but also had aching sensations on the entire left side of the body that lasted most of the day.  The patient had associated photophobia and phonophobia and some nausea with the symptoms.  The patient did well for the next week but then had a recurrence of the same event.  Since that time, the episodes have become more frequent, and now headaches are occurring without the left-sided achy sensations as well.  The patient may be having 3 to 5 headache days a week.  She may have headaches on the left side of the head primarily but occasionally she will have headaches on the right.  She has undergone MRI of the brain that is unremarkable.  She has had a sedimentation rate that was normal at 11.  The patient reports no residual numbness or weakness of the face, arms, legs.  She notes fatigue with the headache and she also notes that if she can get to sleep this will help the headache.  She denies any visual field changes or confusion with the headache.  She is sent to this office for an evaluation.  She has been using Maxalt which did not help her headache and Imitrex does help the headache some but both these medications resulted in tightness sensations of the jaw.   Past Medical History:  Diagnosis Date  . Diabetes ([place])   . High blood pressure     Past Surgical History:  Procedure Laterality Date  . ANKLE SURGERY Right 11/19/2015  . HERNIA REPAIR  2019     Family History  Problem Relation Age of Onset  . Heart attack Mother   . Diabetes Mother   . High blood pressure Mother   . Stroke Mother   . Heart attack Sister   . Diabetes Sister   . High blood pressure Sister   . Diabetes Brother   . High blood pressure Brother   . Stroke Maternal Grandmother   . Cancer Father     Social history:  reports that she has never smoked. She has never used smokeless tobacco. She reports current alcohol use. She reports that she does not use drugs.  Medications:  Prior to Admission medications   Medication Sig Start Date End Date Taking? Authorizing Provider  AMBULATORY NON FORMULARY MEDICATION Medication Name: nebulizer machine, tubing and mouthpiece per patient preference and insurance coverage 02/02/19  Yes Emeterio Reeve, DO  Cholecalciferol (VITAMIN D) 2000 units CAPS Take by mouth.   Yes [provider]  ciclopirox (PENLAC) 8 % solution APPLY DAILY ON NAIIL AND SURROUNDING SKIN OVER PREVIOUS COAT. AFTER 7 DAYS REMOVE WITH ALCOHOL AND REPEAT CYCLE 09/30/18  Yes [provider]  fluticasone (FLONASE) 50 MCG/ACT nasal spray One spray in each nostril twice a day, use left hand for right nostril, and right hand for left nostril. 07/22/18  Yes Emeterio Reeve, DO  glucose blood (ACCU-CHEK AVIVA) test strip Dx DM E11.9 Check  fasting blood sugar every morning and 2 hours after largest meal of the day. 03/23/19  Yes Sunnie Nielsen, DO  Glutamic Acid HCl (L-GLUTAMIC ACID PO) Take by mouth.   Yes [provider]  Insulin Pen Needle (NOVOFINE) 32G X 6 MM MISC INJECT UNDER THE SKIN DAILY WITH LIRAGLUTIDE 12/28/18  Yes Sunnie Nielsen, DO  ipratropium-albuterol (DUONEB) 0.5-2.5 (3) MG/3ML SOLN Inhale 3 mLs into the lungs every 4 (four) hours as needed. 02/02/19  Yes Alexander, Dorene Grebe, DO  L-Methylfolate-Algae-B12-B6 (METANX PO) Take by mouth.   Yes [provider]  Lancets 30G MISC Dx DM E11.9 Check fasting blood  sugar every morning and 2 hours after largest meal of the day. 03/23/19  Yes Sunnie Nielsen, DO  metFORMIN (GLUCOPHAGE XR) 500 MG 24 hr tablet Take 2 tablets (1,000 mg total) by mouth 2 (two) times daily. 02/23/19 02/23/20 Yes Alexander, Dorene Grebe, DO  Methylcobalamin (METHYL B-12) 1000 MCG LOZG Take by mouth.   Yes [provider]  olmesartan-hydrochlorothiazide (BENICAR HCT) 20-12.5 MG tablet TAKE 1 TABLET DAILY 04/29/19  Yes Sunnie Nielsen, DO  Probiotic Product (PROBIOTIC-10) CAPS Take by mouth.   Yes [provider]  VITAMIN K PO Take by mouth.   Yes [provider]  SUMAtriptan (IMITREX) 50 MG tablet Take 1 tablet (50 mg total) by mouth once for 1 dose. May repeat after 2 hours if headache persists or recurs. 04/23/19 04/23/19  Sunnie Nielsen, DO      Allergies  Allergen Reactions  . Doxycycline Other (See Comments)    Pt states," I don't remember.'    . Gabapentin Other (See Comments)    Slurred speech.       ROS:  Out of a complete 14 system review of symptoms, the patient complains only of the following symptoms, and all other reviewed systems are negative.  Headache Nausea  Blood pressure 126/84, pulse 92, temperature 99.3 F (37.4 C), temperature source Temporal, height 5\' 7"  (1.702 m), weight 259 lb 5 oz (117.6 kg), SpO2 97 %.  Physical Exam  General: The patient is alert and cooperative at the time of the examination.  The patient is moderately to markedly obese.  Eyes: Pupils are equal, round, and reactive to light. Discs are flat bilaterally.  Neck: The neck is supple, no carotid bruits are noted.  Respiratory: The respiratory examination is clear.  Cardiovascular: The cardiovascular examination reveals a regular rate and rhythm, no obvious murmurs or rubs are noted.  Skin: Extremities are without significant edema.  Neurologic Exam  Mental status: The patient is alert and oriented x 3 at the time of the examination. The patient  has apparent normal recent and remote memory, with an apparently normal attention span and concentration ability.  Cranial nerves: Facial symmetry is present. There is good sensation of the face to pinprick and soft touch bilaterally. The strength of the facial muscles and the muscles to head turning and shoulder shrug are normal bilaterally. Speech is well enunciated, no aphasia or dysarthria is noted. Extraocular movements are full. Visual fields are full. The tongue is midline, and the patient has symmetric elevation of the soft palate. No obvious hearing deficits are noted.  Motor: The motor testing reveals 5 over 5 strength of all 4 extremities. Good symmetric motor tone is noted throughout.  Sensory: Sensory testing is intact to pinprick, soft touch, vibration sensation, and position sense on all 4 extremities. No evidence of extinction is noted.  Coordination: Cerebellar testing reveals good finger-nose-finger and heel-to-shin bilaterally.  Gait and station: Gait is normal. Tandem gait is normal. Romberg is negative. No drift is seen.  Reflexes: Deep tendon reflexes are symmetric and normal bilaterally. Toes are downgoing bilaterally.   MRI brain 05/31/19:  IMPRESSION: Negative for acute abnormality. Several small white matter hyperintensities bilaterally are nonspecific. Differential diagnosis includes complex migraine headache, chronic ischemia, and demyelinating disease.  * MRI scan images were reviewed online. I agree with the written report.    Assessment/Plan:  1.  Migraine headache  The patient is having some unusual features with the headache with left-sided achy sensations but the rest of the syndrome sounds very consistent with migraine with photophobia, phonophobia, nausea, and headaches that are unilateral but may alternate from one side of the head to the next.  The patient in the past has had menstrual migraine, and it is possible she may be going through early  menopause once again activating her headache.  She will placed on Topamax working up to 75 mg at night and she will be given diclofenac potassium to take if needed.  She will follow-up here in 3 months, she will call for any dose adjustments.  Marlan Palau. Keith Jashawna Reever MD 06/15/2019 2:29 PM  [facility] [address] [place], [place] [id number]  Phone [phone] Fax [phone]

## 2019-06-15 NOTE — Patient Instructions (Signed)
We will start Topamax for the headache.  Topamax (topiramate) is a seizure medication that has an FDA approval for seizures and for migraine headache. Potential side effects of this medication include weight loss, cognitive slowing, tingling in the fingers and toes, and carbonated drinks will taste bad. If any significant side effects are noted on this drug, please contact our office.   We will use diclofenac potassium as needed for the headache.

## 2019-06-21 ENCOUNTER — Telehealth: Payer: Self-pay | Admitting: Neurology

## 2019-06-21 MED ORDER — PROPRANOLOL HCL 20 MG PO TABS: 20.0000 mg | ORAL_TABLET | Freq: Two times a day (BID) | ORAL | 3 refills | Status: DC

## 2019-06-21 NOTE — Telephone Encounter (Signed)
Pt is calling in stating she is having a reaction to topiramate (TOPAMAX) 25 MG tablet ,tingling in fingers and toes , at night having different smells that would wake her up and equilibrium is off

## 2019-06-21 NOTE — Telephone Encounter (Signed)
I called the patient.  The paresthesias were quite intense, the patient felt off balance the next morning from even low-dose 25 mg Topamax dosing.  She will stop the Topamax, we will start propranolol, she felt that the Topamax did help the headache.

## 2019-06-22 ENCOUNTER — Encounter: Payer: Self-pay | Admitting: Osteopathic Medicine

## 2019-06-22 ENCOUNTER — Ambulatory Visit (INDEPENDENT_AMBULATORY_CARE_PROVIDER_SITE_OTHER): Payer: BC Managed Care – PPO | Admitting: Osteopathic Medicine

## 2019-06-22 VITALS — BP 126/80

## 2019-06-22 DIAGNOSIS — G43809 Other migraine, not intractable, without status migrainosus: Secondary | ICD-10-CM

## 2019-06-22 DIAGNOSIS — M9901 Segmental and somatic dysfunction of cervical region: Secondary | ICD-10-CM | POA: Diagnosis not present

## 2019-06-22 DIAGNOSIS — M791 Myalgia, unspecified site: Secondary | ICD-10-CM | POA: Diagnosis not present

## 2019-06-22 DIAGNOSIS — M50322 Other cervical disc degeneration at C5-C6 level: Secondary | ICD-10-CM | POA: Diagnosis not present

## 2019-06-22 DIAGNOSIS — M9902 Segmental and somatic dysfunction of thoracic region: Secondary | ICD-10-CM | POA: Diagnosis not present

## 2019-06-22 DIAGNOSIS — N951 Menopausal and female climacteric states: Secondary | ICD-10-CM | POA: Diagnosis not present

## 2019-06-22 DIAGNOSIS — M9903 Segmental and somatic dysfunction of lumbar region: Secondary | ICD-10-CM | POA: Diagnosis not present

## 2019-06-22 NOTE — Progress Notes (Signed)
Virtual Visit via Video   I connected with      Bridget Lawrence on 06/22/19 at 1:00 by a telemedicine application and verified that I am speaking with the correct person using two identifiers.  Patient is at home I am in office   I discussed the limitations of evaluation and management by telemedicine and the availability of in person appointments. The patient expressed understanding and agreed to proceed.  History of Present Illness: Bridget Lawrence is a 48 y.o. female who would like to discuss follow-up headaches, left side body pain   Recently had visit with neurologist, he was discussed migraine treatment with her, no good explanation for left-sided pain but also no serious red flags for life-threatening illness.  Patient would like to discuss hormone replacement therapy, possible contribution of perimenopause to her symptoms. LMP last week, periods all over the place when previously they were fairly regular.      Observations/Objective: BP 126/80 (Patient Position: Sitting, Cuff Size: Normal)   LMP 06/14/2019  BP Readings from Last 3 Encounters:  06/22/19 126/80  06/15/19 126/84  06/07/19 124/79   Exam: Normal Speech.    Lab and Radiology Results No results found for this or any previous visit (from the past 72 hour(s)). No results found.     Assessment and Plan: 48 y.o. female with The primary encounter diagnosis was Other migraine without status migrainosus, not intractable. Diagnoses of Myalgia and Perimenopause were also pertinent to this visit.  Patient would like to hold off on medication adjustment at this point.  I have sent some extra information on hormone replacement therapy risks versus benefits.  PDMP not reviewed this encounter. No orders of the defined types were placed in this encounter.  No orders of the defined types were placed in this encounter.  Patient Instructions  Plan:  See below for more information on hormone replacement therapy.  I  think this might be a good option for you, I could not guarantee that it would alleviate the headache symptoms or of the left-sided pain but it might be something to try.   [email or web address]     Instructions sent via MyChart. If MyChart not available, pt was given option for info via personal e-mail w/ no guarantee of protected health info over unsecured e-mail communication, and MyChart sign-up instructions were included.   Follow Up Instructions: Return in about 2 months (around 08/28/2019) for Follow-up labs, visit with Dr. Sheppard Coil okay virtual after that.  Lab orders are in..    I discussed the assessment and treatment plan with the patient. The patient was provided an opportunity to ask questions and all were answered. The patient agreed with the plan and demonstrated an understanding of the instructions.   The patient was advised to call back or seek an in-person evaluation if any new concerns, if symptoms worsen or if the condition fails to improve as anticipated.  25 minutes of non-face-to-face time was provided during this encounter.                      Historical information moved to improve visibility of documentation.  Past Medical History:  Diagnosis Date  . Diabetes ([place])   . High blood pressure   . 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 ANKLE SURGERY Right 11/19/2015  . HERNIA REPAIR  2019   Social History   Tobacco Use  . Smoking status: Never Smoker  .  Smokeless tobacco: Never Used  Substance Use Topics  . Alcohol use: Yes    Comment: 4/wk   family history includes Cancer in her father; Diabetes in her brother, mother, and sister; Heart attack in her mother and sister; High blood pressure in her brother, mother, and sister; Stroke in her maternal grandmother and mother.  Medications: Current Outpatient Medications   Medication Sig Dispense Refill  . AMBULATORY NON FORMULARY MEDICATION Medication Name: nebulizer machine, tubing and mouthpiece per patient preference and insurance coverage 1 Units prn  . Cholecalciferol (VITAMIN D) 2000 units CAPS Take by mouth.    . ciclopirox (PENLAC) 8 % solution APPLY DAILY ON NAIIL AND SURROUNDING SKIN OVER PREVIOUS COAT. AFTER 7 DAYS REMOVE WITH ALCOHOL AND REPEAT CYCLE    . diclofenac (CATAFLAM) 50 MG tablet Take 1 tablet (50 mg total) by mouth 3 (three) times daily as needed. 30 tablet 2  . fluticasone (FLONASE) 50 MCG/ACT nasal spray One spray in each nostril twice a day, use left hand for right nostril, and right hand for left nostril. 48 g 3  . glucose blood (ACCU-CHEK AVIVA) test strip Dx DM E11.9 Check fasting blood sugar every morning and 2 hours after largest meal of the day. 100 each prn  . Glutamic Acid HCl (L-GLUTAMIC ACID PO) Take by mouth.    . Insulin Pen Needle (NOVOFINE) 32G X 6 MM MISC INJECT UNDER THE SKIN DAILY WITH LIRAGLUTIDE 100 each 1  . ipratropium-albuterol (DUONEB) 0.5-2.5 (3) MG/3ML SOLN Inhale 3 mLs into the lungs every 4 (four) hours as needed. 360 mL 3  . L-Methylfolate-Algae-B12-B6 (METANX PO) Take by mouth.    . Lancets 30G MISC Dx DM E11.9 Check fasting blood sugar every morning and 2 hours after largest meal of the day. 100 each prn  . metFORMIN (GLUCOPHAGE XR) 500 MG 24 hr tablet Take 2 tablets (1,000 mg total) by mouth 2 (two) times daily. 360 tablet 3  . olmesartan-hydrochlorothiazide (BENICAR HCT) 20-12.5 MG tablet TAKE 1 TABLET DAILY 90 tablet 3  . Probiotic Product (PROBIOTIC-10) CAPS Take by mouth.    . propranolol (INDERAL) 20 MG tablet Take 1 tablet (20 mg total) by mouth 2 (two) times daily. 60 tablet 3  . VITAMIN K PO Take by mouth.    . Methylcobalamin (METHYL B-12) 1000 MCG LOZG Take by mouth.    . SUMAtriptan (IMITREX) 50 MG tablet Take 1 tablet (50 mg total) by mouth once for 1 dose. May repeat after 2 hours if headache  persists or recurs. 10 tablet 0   No current facility-administered medications for this visit.    Allergies  Allergen Reactions  . Doxycycline Other (See Comments)    Pt states," I don't remember.'    . Gabapentin Other (See Comments)    Slurred speech.     . Topamax [Topiramate]     tingling    PDMP not reviewed this encounter. No orders of the defined types were placed in this encounter.  No orders of the defined types were placed in this encounter.

## 2019-06-23 NOTE — Patient Instructions (Signed)
Plan:  See below for more information on hormone replacement therapy.  I think this might be a good option for you, I could not guarantee that it would alleviate the headache symptoms or of the left-sided pain but it might be something to try.   [email or web address]

## 2019-06-25 DIAGNOSIS — F4322 Adjustment disorder with anxiety: Secondary | ICD-10-CM | POA: Diagnosis not present

## 2019-06-29 ENCOUNTER — Telehealth: Payer: Self-pay | Admitting: Neurology

## 2019-06-29 MED ORDER — PROPRANOLOL HCL 20 MG PO TABS: 40.0000 mg | ORAL_TABLET | Freq: Two times a day (BID) | ORAL | 3 refills | Status: DC

## 2019-06-29 NOTE — Telephone Encounter (Signed)
I attempted to reach the pt. Pt stated she could not hear me well so I disconnected the call. Will try again later.

## 2019-06-29 NOTE — Telephone Encounter (Signed)
I called the patient.  She is on low-dose propranolol taking 20 mg twice daily, she checks her blood pressures at home, they are running in the 643 systolic range, she could tolerate higher dose of the propranolol taking 40 mg twice daily.  She will be sure to check her pulse when she checks her blood pressure.  She is still having ongoing daily headaches.  If the headaches are not getting any better, may try Aimovig or Effexor in the future, she cannot go on steroids as this has significantly worsened her blood sugars in the past.

## 2019-06-29 NOTE — Addendum Note (Signed)
Addended by: Kathrynn Ducking on: 06/29/2019 05:30 PM   Modules accepted: Orders

## 2019-06-29 NOTE — Telephone Encounter (Signed)
Pt reports propranolol has not provided relief for her migraines. Would you like to further discuss medication regimen.

## 2019-06-29 NOTE — Telephone Encounter (Signed)
Pt called wanting to know when her propranolol (INDERAL) 20 MG tablet will be kicking in since she has not felt any relief since she started taking it. Please advise.

## 2019-07-03 DIAGNOSIS — G4733 Obstructive sleep apnea (adult) (pediatric): Secondary | ICD-10-CM | POA: Diagnosis not present

## 2019-07-05 ENCOUNTER — Telehealth: Payer: Self-pay | Admitting: Neurology

## 2019-07-05 NOTE — Telephone Encounter (Signed)
Pt is asking for a call from RN to discuss diarrhea, muscle cramps and ankles swelling since her diarrhea has been increased

## 2019-07-05 NOTE — Telephone Encounter (Signed)
I reached out to the pt. She reports on 06/29/19 she started taking 40 mg of propranolol bid as recommended by Dr. Jannifer Franklin.  Pt states on 06/30/2019 she started having diarrhea, muscle cramps and mild swelling in her ankles. Pt states on 07/03/2019 she d/c the propranolol dosage and her sx have improved.  Pt reported her headaches did not benefit from the higher dosage of propranolol.   Dr. Jannifer Franklin mentioned on 06/29/2019 about changing to Aimovig or Effexor in the future if propranolol did not help control headaches. He also mentioned pt cannot take steroids due to med significantly worsening her blood sugars in the past.   Pt was advised Dr. Jannifer Franklin is currently out of the office and will return on 07/12/2019. I advised the pt to continue to staff off the propranolol and I would send message to covering MD.  Pt was agreeable.

## 2019-07-05 NOTE — Telephone Encounter (Signed)
Ok to consider starting aimovig. -VRP

## 2019-07-06 DIAGNOSIS — G4733 Obstructive sleep apnea (adult) (pediatric): Secondary | ICD-10-CM | POA: Diagnosis not present

## 2019-07-06 NOTE — Telephone Encounter (Signed)
Yes aimovig 70mg  monthly. -VRP

## 2019-07-06 NOTE — Addendum Note (Signed)
Addended by: Verlin Grills T on: 07/06/2019 09:51 AM   Modules accepted: Orders

## 2019-07-06 NOTE — Telephone Encounter (Signed)
I reached out to the pt. She is agreeable to trying the aimovig. Wanted to confirm can we start at the 70 mg dosage?

## 2019-07-07 DIAGNOSIS — F4322 Adjustment disorder with anxiety: Secondary | ICD-10-CM | POA: Diagnosis not present

## 2019-07-07 NOTE — Telephone Encounter (Signed)
I called pt that the Aimovig 70mg  monthly was sent to her listed pharmacy. I stated the medication may require a PA because its new to her. PT verbalized understanding.

## 2019-07-09 ENCOUNTER — Encounter: Payer: Self-pay | Admitting: Osteopathic Medicine

## 2019-07-12 ENCOUNTER — Telehealth: Payer: Self-pay

## 2019-07-12 MED ORDER — AIMOVIG 140 MG/ML ~~LOC~~ SOAJ: 140.0000 mg | SUBCUTANEOUS | 3 refills | Status: DC

## 2019-07-12 NOTE — Telephone Encounter (Signed)
Pt call after hours that aimovig rx was not receive to her pharmacy. Please advise.

## 2019-07-12 NOTE — Telephone Encounter (Signed)
I called the patient.  The Aimovig prescription was never sent in, I will really send the prescription.  And on average 2 Excedrin migraine tablets daily, this as a potential for causing rebound.  She is having frequent headaches.

## 2019-07-13 ENCOUNTER — Ambulatory Visit (INDEPENDENT_AMBULATORY_CARE_PROVIDER_SITE_OTHER): Payer: BC Managed Care – PPO | Admitting: Osteopathic Medicine

## 2019-07-13 ENCOUNTER — Other Ambulatory Visit: Payer: Self-pay

## 2019-07-13 ENCOUNTER — Encounter: Payer: Self-pay | Admitting: Osteopathic Medicine

## 2019-07-13 ENCOUNTER — Telehealth: Payer: Self-pay

## 2019-07-13 VITALS — BP 120/75 | HR 93 | Temp 98.2°F | Wt 251.9 lb

## 2019-07-13 DIAGNOSIS — G4489 Other headache syndrome: Secondary | ICD-10-CM | POA: Diagnosis not present

## 2019-07-13 DIAGNOSIS — G43711 Chronic migraine without aura, intractable, with status migrainosus: Secondary | ICD-10-CM | POA: Diagnosis not present

## 2019-07-13 MED ORDER — METHYLPREDNISOLONE SODIUM SUCC 125 MG IJ SOLR
125.0000 mg | Freq: Once | INTRAMUSCULAR | Status: AC
  Administered 2019-07-13: 125 mg via INTRAMUSCULAR

## 2019-07-13 MED ORDER — PROMETHAZINE HCL 25 MG PO TABS: 25.0000 mg | ORAL_TABLET | Freq: Four times a day (QID) | ORAL | 2 refills | Status: DC | PRN

## 2019-07-13 MED ORDER — KETOROLAC TROMETHAMINE 60 MG/2ML IM SOLN
60.0000 mg | Freq: Once | INTRAMUSCULAR | Status: AC
  Administered 2019-07-13: 60 mg via INTRAMUSCULAR

## 2019-07-13 MED ORDER — PROMETHAZINE HCL 25 MG/ML IJ SOLN
25.0000 mg | Freq: Once | INTRAMUSCULAR | Status: AC
  Administered 2019-07-13: 25 mg via INTRAMUSCULAR

## 2019-07-13 NOTE — Patient Instructions (Addendum)
I do not really have a good explanation for your symptoms other than something seems to be going on centrally/in the brain, the most likely explanation is migraine especially given the otherwise unremarkable MRI results.    I think getting the migraines under control is going to be a challenge, given the frequency of headaches we may also be looking at medication rebound headache.  Getting on a good preventive regimen is going to be essential to get everything under control, but this can take time to reach full effect.    I'm going to reach out to your neurologist so we can cooperate on medications.  I would ideally like to try gabapentin or Lyrica but if you have had adverse reactions to these medications in the past, these would be avoided.  Let's at lest add something today for nausea control which may also help migraines.   I think we need to repeat the blood work also to recheck the minor abnormalities that were present a few months ago especially liver enzymes, sodium levels, sugars.   Please contact me or your neurologist if something changes or gets worse, or seek emergency medical attention if needed, otherwise I will reach out to you over the next couple of days once I have chatted a bit more with your neurology team.

## 2019-07-13 NOTE — Progress Notes (Signed)
HPI: Bridget Lawrence is a 48 y.o. female who  has a past medical history of Diabetes (HCC), High blood pressure, and Migraine variant with headache (06/15/2019).  [facility], 07/13/19,  for chief complaint of:  Left sided pain  Several virtual visits with me and several interventions from neurology for this issue.  First was 04/23/2019, at that point whole left side of the body hurts head to toes for about 3 weeks described as intermittent burning type sensation, really nothing on the right side.  Associated with headache.  History of migraines in her 3120s, recent headaches associated with light and sound sensitivity, given this we were working from the theory that atypical migraine could be an issue, we got MRI of the brain.    Patient finally obtained labs 05/25/2019, demonstrated elevated CRP greater than 10, elevated liver enzymes, A1c above goal at 8.1. Everything else was normal, including sedimentation rate.  Patient did not end up getting the MRI done for some time, we followed up on the issue 05/31/2019: Nonspecific white matter abnormalities concerning for potential atypical migraine, chronic ischemia, demyelinating illness.    At next visit 06/07/2019, the left-sided pain had improved/essentially resolved over the previous week.  We changed from Imitrex to Maxalt due to concerns for side effects of nausea and jaw tightness, we referred to neurology for second opinion.  Patient was seen by neurology 06/15/2019, were not particularly concerned about the MRI or the sed rate results.  They mention some unusual features with a headache given the left-sided aching sensation but the rest of her symptoms were consistent with migraine.  History of menstrual migraine, possibility of early menopause was discussed which may be activating the headaches.  She was placed on Topamax working up to 75 mg nightly, advised follow-up in 3 months.  Topamax had  to be stopped 06/21/2019 due to the paresthesias.  Neurology started propranolol.  SHe followed up with me 06/22/2019 to discuss possibility of initiating hormone replacement therapy for perimenopause.  It was not really sure she wanted to start anything, I gave her instructions on additional information [facility].    Neurology increased propranolol from 20 to 40 mg twice daily on 06/29/2019, discussed possibly starting Aimovig or Effexor, advised against steroids as this had history of worsening blood sugars.Aimovig was eventually sent to pharmacy on 07/05/2019 by neurology.  Patient states that there was an issue getting this medication and it was finally actually called in yesterday, she has not started it yet.   Today 07/13/19 with me: Wanted to get checked out here instead of at neurologist given worsening of the burning sensation on her left side.  She reports persistent headache as well, every day, for the past month or so.  She has not been taking any medications for this, she is off the propranolol as well at this point.  In summary, left-sided pain . Location: Entire left side of the body, head to toes . Quality: Burning sensation, skin sensitivity, "feels inflamed" tingling like she is being poked with needles . Duration: since 03/2019 . Timing: Waxes and wanes but is constant . 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 Modifying factors: Medications as noted above has also tried icing the joints on the left side, nothing really seems to make a difference. . Assoc signs/symptoms: Feels like her balance is off.  Headache ongoing every day since July  At today's visit 07/13/19 ... PMH, PSH, FH reviewed and updated as needed.  Current medication list and allergy/intolerance hx reviewed and updated as needed. (See remainder of HPI, ROS, Phys Exam below)   No results found.  No results found for this or any previous visit (from the  past 72 hour(s)).        ASSESSMENT/PLAN: The primary encounter diagnosis was Intractable chronic migraine without aura and with status migrainosus. A diagnosis of Other headache syndrome was also pertinent to this visit.   Discussed trying gabapentin or Lyrica for the burning type sensations to at least gain some symptomatic control of the left-sided pain, but patient reports previous allergy to gabapentin.  Reports that this caused slurring of speech.  Eye movement on physical exam did not elicit any nystagmus but patient reported some feeling of sensitivity/dizziness when looking to the left.  At this point, I am still at a bit of a loss with regard to underlying diagnosis, I think that atypical migraine is probably a good working diagnosis but we are definitely in in a pill bottle given the chronicity of the headache, will trial abortive treatment today as below with Phenergan, Toradol, steroids (I think the short-term spike in blood sugar is acceptable given the potential benefit to quell the migraines)  We will forward this note to neurology, will see if they have any additional input. Depakote? Fioricet?       Orders Placed This Encounter  Procedures  . High sensitivity CRP  . Sedimentation rate  . COMPLETE METABOLIC PANEL WITH GFR  . CBC     Meds ordered this encounter  Medications  . promethazine (PHENERGAN) injection 25 mg  . ketorolac (TORADOL) injection 60 mg  . methylPREDNISolone sodium succinate (SOLU-MEDROL) 125 mg/2 mL injection 125 mg  . promethazine (PHENERGAN) 25 MG tablet    Sig: Take 1 tablet (25 mg total) by mouth every 6 (six) hours as needed for nausea or vomiting.    Dispense:  30 tablet    Refill:  2    Patient Instructions  I do not really have a good explanation for your symptoms other than something seems to be going on centrally/in the brain, the most likely explanation is migraine especially given the otherwise unremarkable MRI results.    I  think getting the migraines under control is going to be a challenge, given the frequency of headaches we may also be looking at medication rebound headache.  Getting on a good preventive regimen is going to be essential to get everything under control, but this can take time to reach full effect.    I'm going to reach out to your neurologist so we can cooperate on medications.  I would ideally like to try gabapentin or Lyrica but if you have had adverse reactions to these medications in the past, these would be avoided.  Let's at lest add something today for nausea control which may also help migraines.   I think we need to repeat the blood work also to recheck the minor abnormalities that were present a few months ago especially liver enzymes, sodium levels, sugars.   Please contact me or your neurologist if something changes or gets worse, or seek emergency medical attention if needed, otherwise I will reach out to you over the next couple of days once I have chatted a bit more with your neurology team.       Follow-up plan: Return for RECHECK HEADACHE & LEFT SIDED PAIN PENDING RESULTS /  IF WORSE OR CHANGE.                                                 ################################################# ################################################# ################################################# #################################################    Current Meds  Medication Sig  . AMBULATORY NON FORMULARY MEDICATION Medication Name: nebulizer machine, tubing and mouthpiece per patient preference and insurance coverage  . Cholecalciferol (VITAMIN D) 2000 units CAPS Take by mouth.  . ciclopirox (PENLAC) 8 % solution APPLY DAILY ON NAIIL AND SURROUNDING SKIN OVER PREVIOUS COAT. AFTER 7 DAYS REMOVE WITH ALCOHOL AND REPEAT CYCLE  . Erenumab-aooe (AIMOVIG) 140 MG/ML SOAJ Inject 140 mg into the skin every 30 (thirty) days.  Marland Kitchen glucose blood  (ACCU-CHEK AVIVA) test strip Dx DM E11.9 Check fasting blood sugar every morning and 2 hours after largest meal of the day.  . Glutamic Acid HCl (L-GLUTAMIC ACID PO) Take by mouth.  . Insulin Pen Needle (NOVOFINE) 32G X 6 MM MISC INJECT UNDER THE SKIN DAILY WITH LIRAGLUTIDE  . ipratropium-albuterol (DUONEB) 0.5-2.5 (3) MG/3ML SOLN Inhale 3 mLs into the lungs every 4 (four) hours as needed.  Marland Kitchen L-Methylfolate-Algae-B12-B6 (METANX PO) Take by mouth.  . Lancets 30G MISC Dx DM E11.9 Check fasting blood sugar every morning and 2 hours after largest meal of the day.  . metFORMIN (GLUCOPHAGE XR) 500 MG 24 hr tablet Take 2 tablets (1,000 mg total) by mouth 2 (two) times daily.  Marland Kitchen olmesartan-hydrochlorothiazide (BENICAR HCT) 20-12.5 MG tablet TAKE 1 TABLET DAILY  . Probiotic Product (PROBIOTIC-10) CAPS Take by mouth.  Marland Kitchen VITAMIN K PO Take by mouth.    Allergies  Allergen Reactions  . Doxycycline Other (See Comments)    Pt states," I don't remember.'    . Gabapentin Other (See Comments)    Slurred speech.     . Propranolol Swelling    GI upset and diarrhea  . Topamax [Topiramate]     Tingling of fingers and toes Altered sense of smell       Review of Systems:  Constitutional: No recent illness, no fever  HEENT: +headache, no vision change  Cardiac: No  chest pain, No  pressure, No palpitations  Respiratory:  No  shortness of breath. No  Cough  Gastrointestinal: No  abdominal pain  Musculoskeletal: No new myalgia/arthralgia  Skin: No  Rash  Neurologic: No  weakness, +Dizziness  Psychiatric: No  concerns with depression, No  concerns with anxiety  Exam:  BP 120/75 (BP Location: Left Arm, Patient Position: Sitting, Cuff Size: Large)   Pulse 93   Temp 98.2 F (36.8 C) (Oral)   Wt 251 lb 14.4 oz (114.3 kg)   LMP 06/14/2019   BMI 39.45 kg/m   Constitutional: VS see above. General Appearance: alert, well-developed, well-nourished, NAD  Eyes: Normal lids and conjunctive,  non-icteric sclera, EOMI, patient declined pupillary exam with bright light due to photosensitivity, no nystagmus but looking to the L elicits some dizziness per patient   Ears, Nose, Mouth, Throat: MMM, Normal external inspection ears/nares/mouth/lips/gums.  Neck: No masses, trachea midline.   Respiratory: Normal respiratory effort. no wheeze, no rhonchi, no rales  Cardiovascular: S1/S2 normal, no murmur, no rub/gallop auscultated. RRR.   Musculoskeletal: Gait normal. Symmetric and independent movement of all extremities  Neurological: Normal balance/coordination. No tremor.  5 out of 5 in all 4 extremities, cerebellar reflexes intact.  Skin: warm, dry,  intact.   Psychiatric: Normal judgment/insight. Normal mood and affect. Oriented x3.     Note routed to neurology, will see if they have any additional input.   Visit summary with medication list and pertinent instructions was printed for patient to review, patient was advised to alert us if any updates are needed. All questions at time of visit were answered - patient instructed to contact office with any additional concerns. ER/RTC precautions were reviewed with the patient and understanding verbalized.   Note: Total time spent 40 minutes, greater than 50% of the visit was spent face-to-face counseling and coordinating care for the following: The primary encounter diagnosis was Intractable chronic migraine without aura and with status migrainosus. A diagnosis of Other headache syndrome was also pertinent to this visit.Marland Kitchen.  Please note: voice recognition software was used to produce this document, and typos may escape review. Please contact Dr. Lyn HollingsheadAlexander for any needed clarifications.    Follow up plan: Return for RECHECK HEADACHE & LEFT SIDED PAIN PENDING RESULTS / IF WORSE OR CHANGE.

## 2019-07-13 NOTE — Telephone Encounter (Signed)
PA approved via Covermy meds. approved under "plan response"  Key: [id number]

## 2019-07-14 ENCOUNTER — Encounter: Payer: Self-pay | Admitting: Osteopathic Medicine

## 2019-07-14 ENCOUNTER — Telehealth: Payer: Self-pay | Admitting: Osteopathic Medicine

## 2019-07-14 LAB — HIGH SENSITIVITY CRP: hs-CRP: 8.9 mg/L — ABNORMAL HIGH

## 2019-07-14 LAB — COMPLETE METABOLIC PANEL WITH GFR
AG Ratio: 1.3 (calc) (ref 1.0–2.5)
ALT: 71 U/L — ABNORMAL HIGH (ref 6–29)
AST: 54 U/L — ABNORMAL HIGH (ref 10–35)
Albumin: 4.4 g/dL (ref 3.6–5.1)
Alkaline phosphatase (APISO): 43 U/L (ref 37–153)
BUN: 15 mg/dL (ref 7–25)
CO2: 24 mmol/L (ref 20–32)
Calcium: 9.3 mg/dL (ref 8.6–10.4)
Chloride: 103 mmol/L (ref 98–110)
Creat: 0.64 mg/dL (ref 0.50–1.05)
GFR, Est African American: 121 mL/min/{1.73_m2} (ref >=60)
GFR, Est Non African American: 104 mL/min/{1.73_m2} (ref >=60)
Globulin: 3.3 g/dL (calc) (ref 1.9–3.7)
Glucose, Bld: 149 mg/dL — ABNORMAL HIGH (ref 65–99)
Potassium: 3.9 mmol/L (ref 3.5–5.3)
Sodium: 137 mmol/L (ref 135–146)
Total Bilirubin: 0.3 mg/dL (ref 0.2–1.2)
Total Protein: 7.7 g/dL (ref 6.1–8.1)

## 2019-07-14 LAB — CBC
HCT: 35.4 % (ref 35.0–45.0)
Hemoglobin: 11.5 g/dL — ABNORMAL LOW (ref 11.7–15.5)
MCH: 28.6 pg (ref 27.0–33.0)
MCHC: 32.5 g/dL (ref 32.0–36.0)
MCV: 88.1 fL (ref 80.0–100.0)
MPV: 10.7 fL (ref 7.5–12.5)
Platelets: 343 10*3/uL (ref 140–400)
RBC: 4.02 10*6/uL (ref 3.80–5.10)
RDW: 12.3 % (ref 11.0–15.0)
WBC: 5.3 10*3/uL (ref 3.8–10.8)

## 2019-07-14 LAB — SEDIMENTATION RATE: Sed Rate: 17 mm/h (ref 0–20)

## 2019-07-14 NOTE — Telephone Encounter (Signed)
Spoke to patient  Gave her advise as noted below. she stated that Aimovig will need a PA. Forwarding message to Barnet Pall to see if she received anything from the pharmacy on this medication. Rhonda Cunningham,CMA

## 2019-07-14 NOTE — Telephone Encounter (Signed)
I called and spoke with the patient and she reports that Dr. Jannifer Franklin prescribed the medication. I let her know their office would need to complete the PA. Patient is aware their office will complete PA and did not have any other questions.

## 2019-07-14 NOTE — Telephone Encounter (Signed)
Please call patient: I heard back from Dr. Jannifer Franklin.  He recommended just starting the Aimovig as soon as possible.  Please call me or Dr. Jannifer Franklin with any additional questions/concerns.

## 2019-07-16 ENCOUNTER — Encounter: Payer: Self-pay | Admitting: Osteopathic Medicine

## 2019-07-20 DIAGNOSIS — M9903 Segmental and somatic dysfunction of lumbar region: Secondary | ICD-10-CM | POA: Diagnosis not present

## 2019-07-20 DIAGNOSIS — M50322 Other cervical disc degeneration at C5-C6 level: Secondary | ICD-10-CM | POA: Diagnosis not present

## 2019-07-20 DIAGNOSIS — M9902 Segmental and somatic dysfunction of thoracic region: Secondary | ICD-10-CM | POA: Diagnosis not present

## 2019-07-20 DIAGNOSIS — M9901 Segmental and somatic dysfunction of cervical region: Secondary | ICD-10-CM | POA: Diagnosis not present

## 2019-07-21 DIAGNOSIS — F4322 Adjustment disorder with anxiety: Secondary | ICD-10-CM | POA: Diagnosis not present

## 2019-07-29 ENCOUNTER — Telehealth: Payer: Self-pay | Admitting: Neurology

## 2019-07-29 DIAGNOSIS — M9903 Segmental and somatic dysfunction of lumbar region: Secondary | ICD-10-CM | POA: Diagnosis not present

## 2019-07-29 DIAGNOSIS — M9902 Segmental and somatic dysfunction of thoracic region: Secondary | ICD-10-CM | POA: Diagnosis not present

## 2019-07-29 DIAGNOSIS — M50322 Other cervical disc degeneration at C5-C6 level: Secondary | ICD-10-CM | POA: Diagnosis not present

## 2019-07-29 DIAGNOSIS — M9901 Segmental and somatic dysfunction of cervical region: Secondary | ICD-10-CM | POA: Diagnosis not present

## 2019-07-29 MED ORDER — NORTRIPTYLINE HCL 10 MG PO CAPS: ORAL_CAPSULE | ORAL | 3 refills | Status: DC

## 2019-07-29 NOTE — Telephone Encounter (Signed)
I called the patient.  The patient was given the 140 mg dose of Aimovig, this cut down on the headache significantly at least 90% for 2 weeks, the headaches are now coming back.  We will add nortriptyline to her regimen to help prevent the headaches.

## 2019-07-29 NOTE — Telephone Encounter (Signed)
Pt called stating that the Erenumab-aooe (AIMOVIG) 140 MG/ML SOAJ is not completely helping with her headaches. Please advise.

## 2019-08-03 DIAGNOSIS — G4733 Obstructive sleep apnea (adult) (pediatric): Secondary | ICD-10-CM | POA: Diagnosis not present

## 2019-08-04 DIAGNOSIS — F4322 Adjustment disorder with anxiety: Secondary | ICD-10-CM | POA: Diagnosis not present

## 2019-08-10 ENCOUNTER — Ambulatory Visit (INDEPENDENT_AMBULATORY_CARE_PROVIDER_SITE_OTHER): Payer: BC Managed Care – PPO | Admitting: Physician Assistant

## 2019-08-10 ENCOUNTER — Encounter: Payer: Self-pay | Admitting: Physician Assistant

## 2019-08-10 VITALS — Temp 96.2°F | Ht 67.0 in | Wt 254.0 lb

## 2019-08-10 DIAGNOSIS — M2550 Pain in unspecified joint: Secondary | ICD-10-CM

## 2019-08-10 DIAGNOSIS — R202 Paresthesia of skin: Secondary | ICD-10-CM | POA: Diagnosis not present

## 2019-08-10 DIAGNOSIS — R200 Anesthesia of skin: Secondary | ICD-10-CM

## 2019-08-10 DIAGNOSIS — G43809 Other migraine, not intractable, without status migrainosus: Secondary | ICD-10-CM

## 2019-08-10 NOTE — Progress Notes (Signed)
Patient ID: Bridget Lawrence, female   DOB: 09-Oct-1971, 48 y.o.   MRN: [id number] .Marland KitchenVirtual Visit via Video Note  I connected with Bridget Lawrence on 08/12/19 at  2:00 PM EDT by a video enabled telemedicine application and verified that I am speaking with the correct person using two identifiers.  Location: Patient: home Provider: clinic   I discussed the limitations of evaluation and management by telemedicine and the availability of in person appointments. The patient expressed understanding and agreed to proceed.  History of Present Illness: Patient is a 48 year old female who calls into the clinic to discuss worsening numbness and tingling on the left side of her body from her head to her toes.  She has noticed these sensations along with increase in headaches and migraines since May 2020.  She has seen her PCP Dr. Lyn Hollingshead and neurologist multiple times.  They have diagnosed her with migraine variant.  They have not known what is causing her numbness and tingling only on the left side of her body.  She has had brain imaging with no abnormalities.  She has noticed this numbness and tingling getting worse and worse especially for the last 2 weeks.  She was seen by her acupuncturist and he suggested Lyme's disease testing.  She would like this today.  She does report that her headaches and migraines have improved since starting Aimovig.  .. Active Ambulatory Problems    Diagnosis Date Noted  . ASCUS of cervix with negative high risk HPV 08/24/2017  . Essential hypertension 02/17/2017  . Fatty liver 03/25/2017  . Food allergy 09/02/2017  . OSA on CPAP 02/04/2018  . Type 2 diabetes mellitus without complication, without long-term current use of insulin (HCC) 02/17/2017  . BMI 39.0-39.9,adult 03/04/2018  . Morbid obesity (HCC) 03/04/2018  . Left foot pain 02/17/2019  . Migraine variant with headache 06/15/2019   Resolved Ambulatory Problems    Diagnosis Date Noted  . No Resolved Ambulatory  Problems   Past Medical History:  Diagnosis Date  . Diabetes (HCC)   . High blood pressure    Reviewed med, allergy, problem list.   Observations/Objective: No acute distress.  Normal appearance and mood.   .. Today's Vitals   08/10/19 1114  Temp: (!) 96.2 F (35.7 C)  TempSrc: Oral  Weight: 254 lb (115.2 kg)  Height: 5\' 7"  (1.702 m)   Body mass index is 39.78 kg/m.   Assessment and Plan: Marland KitchenDiagnoses and all orders for this visit:  Numbness and tingling -     B. burgdorfi antibodies -     B12 and Folate Panel -     VITAMIN D 25 Hydroxy (Vit-D Deficiency, Fractures)  Paresthesia -     B. burgdorfi antibodies -     B12 and Folate Panel -     VITAMIN D 25 Hydroxy (Vit-D Deficiency, Fractures)  Arthralgia, unspecified joint -     B. burgdorfi antibodies -     B12 and Folate Panel -     VITAMIN D 25 Hydroxy (Vit-D Deficiency, Fractures)  Migraine variant with headache   Unclear etiology of the symptoms.  Certainly it seems like she is in the right place working with neurology.  Certainly this could be a abnormal variant of migraines.  It is reasonable with her numbness and tingling and paresthesia to check for Lyme's disease.  I also see that B12 and vitamin D have not been done and we will add those.  Continue to work with PCP and neurologist with  close follow-up.   Follow Up Instructions:    I discussed the assessment and treatment plan with the patient. The patient was provided an opportunity to ask questions and all were answered. The patient agreed with the plan and demonstrated an understanding of the instructions.   The patient was advised to call back or seek an in-person evaluation if the symptoms worsen or if the condition fails to improve as anticipated.   Iran Planas, PA-C

## 2019-08-10 NOTE — Progress Notes (Signed)
Blood sugar this AM - 169   Headache/burning/aching Strange pain on left side of body for several months Saw acupuncturist who advised to check for lyme disease

## 2019-08-11 DIAGNOSIS — R200 Anesthesia of skin: Secondary | ICD-10-CM | POA: Diagnosis not present

## 2019-08-11 DIAGNOSIS — M50322 Other cervical disc degeneration at C5-C6 level: Secondary | ICD-10-CM | POA: Diagnosis not present

## 2019-08-11 DIAGNOSIS — M9903 Segmental and somatic dysfunction of lumbar region: Secondary | ICD-10-CM | POA: Diagnosis not present

## 2019-08-11 DIAGNOSIS — E119 Type 2 diabetes mellitus without complications: Secondary | ICD-10-CM | POA: Diagnosis not present

## 2019-08-11 DIAGNOSIS — R202 Paresthesia of skin: Secondary | ICD-10-CM | POA: Diagnosis not present

## 2019-08-11 DIAGNOSIS — M9902 Segmental and somatic dysfunction of thoracic region: Secondary | ICD-10-CM | POA: Diagnosis not present

## 2019-08-11 DIAGNOSIS — M9901 Segmental and somatic dysfunction of cervical region: Secondary | ICD-10-CM | POA: Diagnosis not present

## 2019-08-11 DIAGNOSIS — E559 Vitamin D deficiency, unspecified: Secondary | ICD-10-CM | POA: Diagnosis not present

## 2019-08-12 ENCOUNTER — Encounter: Payer: Self-pay | Admitting: Physician Assistant

## 2019-08-12 LAB — VITAMIN D 25 HYDROXY (VIT D DEFICIENCY, FRACTURES): Vit D, 25-Hydroxy: 29 ng/mL — ABNORMAL LOW (ref 30–100)

## 2019-08-12 LAB — B12 AND FOLATE PANEL
Folate: 17.4 ng/mL
Vitamin B-12: 743 pg/mL (ref 200–1100)

## 2019-08-12 LAB — B. BURGDORFI ANTIBODIES: B burgdorferi Ab IgG+IgM: <0.9 index

## 2019-08-12 NOTE — Progress Notes (Signed)
Negative for lymes disease.

## 2019-08-12 NOTE — Progress Notes (Signed)
b12 looks GREAT. Vitamin D just a hair low. Are you taking the 2000units daily? Antibodies still pending.

## 2019-08-23 ENCOUNTER — Other Ambulatory Visit: Payer: Self-pay | Admitting: Osteopathic Medicine

## 2019-08-23 DIAGNOSIS — E119 Type 2 diabetes mellitus without complications: Secondary | ICD-10-CM

## 2019-08-26 ENCOUNTER — Telehealth: Payer: Self-pay | Admitting: Osteopathic Medicine

## 2019-08-26 NOTE — Telephone Encounter (Signed)
Yes. Pt can be seen by another provider if available. Or pt can go to UC/ER if symptoms continue or worsen. Thanks.

## 2019-08-26 NOTE — Telephone Encounter (Signed)
Appointment has been made with Dr.Corey. No further questions at this time.

## 2019-08-26 NOTE — Telephone Encounter (Signed)
Patient states that she is having pain in the left side of her body since June and that her blood sugar has been high for the last week. Would like to come in to see PCP. Nothing open in an appropriate spot. Please advise or if it is okay for patient to see another provider per patient. Declined the first available with PCP.

## 2019-08-30 ENCOUNTER — Ambulatory Visit: Payer: BC Managed Care – PPO | Admitting: Family Medicine

## 2019-08-30 DIAGNOSIS — R519 Headache, unspecified: Secondary | ICD-10-CM | POA: Diagnosis not present

## 2019-08-31 ENCOUNTER — Other Ambulatory Visit: Payer: Self-pay

## 2019-08-31 ENCOUNTER — Encounter: Payer: Self-pay | Admitting: Family Medicine

## 2019-08-31 ENCOUNTER — Ambulatory Visit (INDEPENDENT_AMBULATORY_CARE_PROVIDER_SITE_OTHER): Payer: BC Managed Care – PPO | Admitting: Family Medicine

## 2019-08-31 VITALS — BP 150/85 | HR 85 | Temp 98.4°F | Wt 256.0 lb

## 2019-08-31 DIAGNOSIS — R202 Paresthesia of skin: Secondary | ICD-10-CM | POA: Diagnosis not present

## 2019-08-31 DIAGNOSIS — M542 Cervicalgia: Secondary | ICD-10-CM

## 2019-08-31 DIAGNOSIS — M791 Myalgia, unspecified site: Secondary | ICD-10-CM

## 2019-08-31 DIAGNOSIS — G4750 Parasomnia, unspecified: Secondary | ICD-10-CM

## 2019-08-31 DIAGNOSIS — D649 Anemia, unspecified: Secondary | ICD-10-CM | POA: Diagnosis not present

## 2019-08-31 DIAGNOSIS — M9901 Segmental and somatic dysfunction of cervical region: Secondary | ICD-10-CM | POA: Diagnosis not present

## 2019-08-31 DIAGNOSIS — M50322 Other cervical disc degeneration at C5-C6 level: Secondary | ICD-10-CM | POA: Diagnosis not present

## 2019-08-31 DIAGNOSIS — Z113 Encounter for screening for infections with a predominantly sexual mode of transmission: Secondary | ICD-10-CM | POA: Diagnosis not present

## 2019-08-31 DIAGNOSIS — M9902 Segmental and somatic dysfunction of thoracic region: Secondary | ICD-10-CM | POA: Diagnosis not present

## 2019-08-31 DIAGNOSIS — M9903 Segmental and somatic dysfunction of lumbar region: Secondary | ICD-10-CM | POA: Diagnosis not present

## 2019-08-31 DIAGNOSIS — E119 Type 2 diabetes mellitus without complications: Secondary | ICD-10-CM

## 2019-08-31 DIAGNOSIS — R9082 White matter disease, unspecified: Secondary | ICD-10-CM

## 2019-08-31 MED ORDER — ONDANSETRON HCL 4 MG PO TABS: 4.0000 mg | ORAL_TABLET | Freq: Three times a day (TID) | ORAL | 0 refills | Status: DC | PRN

## 2019-08-31 MED ORDER — PREGABALIN 75 MG PO CAPS: 75.0000 mg | ORAL_CAPSULE | Freq: Two times a day (BID) | ORAL | 3 refills | Status: DC

## 2019-08-31 NOTE — Patient Instructions (Signed)
Thank you for coming in today. Follow up with Dr Geraldo Pitter for possible Parasomnia.   Try lyrica. Next medicine up is Cymbalta.   Get labs now.  Get MRI cspine soon.   Reschedule with Dr Sheppard Coil in the near future.   Keep [place] updated.

## 2019-08-31 NOTE — Progress Notes (Signed)
Bridget Lawrence is a 48 y.o. female who presents to [facility]: [place] today for left-sided pain.  Patient has pain occurring on the left side of her body since June of this year.  She notes this has been waxing and waning but is typically getting a bit worse.  She notes the pain occurs from her head all the way down to her toes and does not seem to localize to any one joint.  She cannot recall any injury or new medications that may have caused this.  She said work-up so far with her primary care provider (Dr. Sheppard Coil) as well as Iran Planas PA and neurology.  She had a reasonable metabolic work-up that was effectively normal with the exception of mild anemia.  Additionally she had a brain MRI which was largely normal showing small white matter hyperintensities bilaterally thought to be nonspecific. She saw her neurologist who started Aimovig for migraines.  This is helped her migraine headaches but has not really changed her left-sided pain much. Additionally she recorded herself sleeping and found that she is moving quite a bit in her sleep.  She was not aware of this.  She does have sleep apnea and has been evaluated and treated by Dr. Jefferey Pica sleep medicine.      ROS as above:  Exam:  BP (!) 150/85   Pulse 85   Temp 98.4 F (36.9 C) (Oral)   Wt 256 lb (116.1 kg)   BMI 40.10 kg/m   Wt Readings from Last 5 Encounters:  08/31/19 256 lb (116.1 kg)  08/10/19 254 lb (115.2 kg)  07/13/19 251 lb 14.4 oz (114.3 kg)  06/15/19 259 lb 5 oz (117.6 kg)  06/07/19 251 lb 14.4 oz (114.3 kg)    Gen: Well NAD nontoxic appearing HEENT: EOMI,  MMM Lungs: Normal work of breathing. CTABL Heart: RRR no MRG Abd: NABS, Soft. Nondistended, Nontender Exts: Brisk capillary refill, warm and well perfused.  Neuro: Alert and oriented normal coordination reflexes and sensation and strength  bilateral upper and lower extremities. MSK: C-spine nontender to cervical midline normal cervical motion. Bilateral shoulder elbow wrist normal-appearing normal motion nontender normal strength. Bilateral hip normal-appearing nontender normal motion. Bilateral knee normal-appearing nontender normal motion stable exam. Foot and ankle exam normal for strength stability and motion bilaterally  Lab and Radiology Results EXAM: MRI HEAD WITHOUT AND WITH CONTRAST  TECHNIQUE: Multiplanar, multiecho pulse sequences of the brain and surrounding structures were obtained without and with intravenous contrast.  CONTRAST:  10 mL Gadovist IV  COMPARISON:  None.  FINDINGS: Brain: Ventricle size and cerebral volume normal. Negative for acute infarct, hemorrhage, mass. Several small white matter hyperintensities bilaterally nonspecific. Brainstem and cerebellum normal.  Vascular: Normal arterial flow voids  Skull and upper cervical spine: Negative  Sinuses/Orbits: Negative  Other: None  IMPRESSION: Negative for acute abnormality. Several small white matter hyperintensities bilaterally are nonspecific. Differential diagnosis includes complex migraine headache, chronic ischemia, and demyelinating disease.   Electronically Signed   By: Franchot Gallo M.D.   On: 05/31/2019 15:05  I personally (independently) visualized and performed the interpretation of the images attached in this note.  Component     Latest Ref Rng & Units 05/25/2019 07/13/2019 08/11/2019  Glucose     65 - 99 mg/dL  149 (H)   BUN     7 - 25 mg/dL  15   Creatinine     0.50 - 1.05 mg/dL  0.64   GFR, Est Non African American     > OR = 60 mL/min/1.7573m2  104   GFR, Est African American     > OR = 60 mL/min/1.1973m2  121   BUN/Creatinine Ratio     6 - 22 (calc)  NOT APPLICABLE   Sodium     135 - 146 mmol/L  137   Potassium     3.5 - 5.3 mmol/L  3.9   Chloride     98 - 110 mmol/L  103   CO2     20 - 32  mmol/L  24   Calcium     8.6 - 10.4 mg/dL  9.3   Total Protein     6.1 - 8.1 g/dL  7.7   Albumin MSPROF     3.6 - 5.1 g/dL  4.4   Globulin     1.9 - 3.7 g/dL (calc)  3.3   AG Ratio     1.0 - 2.5 (calc)  1.3   Total Bilirubin     0.2 - 1.2 mg/dL  0.3   Alkaline phosphatase (APISO)     37 - 153 U/L  43   AST     10 - 35 U/L  54 (H)   ALT     6 - 29 U/L  71 (H)   WBC     3.8 - 10.8 Thousand/uL  5.3   RBC     3.80 - 5.10 Million/uL  4.02   Hemoglobin     11.7 - 15.5 g/dL  16.111.5 (L)   HCT     09.635.0 - 45.0 %  35.4   MCV     80.0 - 100.0 fL  88.1   MCH     27.0 - 33.0 pg  28.6   MCHC     32.0 - 36.0 g/dL  04.532.5   RDW     40.911.0 - 15.0 %  12.3   Platelets     140 - 400 Thousand/uL  343   MPV     7.5 - 12.5 fL  10.7   Hemoglobin A1C     <5.7 % of total Hgb 8.1 (H)    Mean Plasma Glucose     (calc) 186    eAG (mmol/L)     (calc) 10.3    Vitamin B12     200 - 1,100 pg/mL   743  Folate     ng/mL   17.4  TSH     mIU/L 1.45    hs-CRP     mg/L  8.9 (H)   Sed Rate     0 - 20 mm/h  17   B burgdorferi Ab IgG+IgM     index   <0.90  Vitamin D, 25-Hydroxy     30 - 100 ng/mL   29 (L)    Assessment and Plan: 48 y.o. female with  Left sided body pain.  Ongoing for over 3 months now with no obvious cause.  Patient has had a reasonable work-up so far with no obvious explanation.  I had a lengthy discussion with patient regarding what work-up has been done so far and regarding neuroanatomy.  I explained that it is hard to have 1 central neurological injury or defect that would explain her entire pain complaint and that her symptoms could possibly be due to more than one thing happening at a time.  Plan to extend work-up including extending metabolic work-up.  Will recheck CBC and  metabolic panel.  We will also check iron stores given her mild anemia.  Will check CK and RPR.  Additionally will proceed with cervical spine MRI.  It is possible that she has cervical spine impingement  causing some unilateral pain.  Additionally she did have some white matter lesions seen on MRI it is possible that she has worsening white matter changes in her cervical spine more characteristic of demyelinating process.    Additionally will try some empiric medications that may help symptom management.  Will use Lyrica trial.  If not better would consider Cymbalta as a next step as well.  Also recommend following back up with her sleep medicine doctor.  She has parasomnias which may be completely unrelated to her pain complaint now but certainly worthwhile pain attention to.  Is possible there is some true correlation here.  Recheck with PCP in about a month.  Return sooner if needed.  Precautions reviewed.  I spent 40 minutes with this patient, greater than 50% was face-to-face time counseling regarding differential diagnosis treatment next step.Marland Kitchen      PDMP not reviewed this encounter. Orders Placed This Encounter  Procedures  . MR Cervical Spine Wo Contrast    Standing Status:   Future    Standing Expiration Date:   10/30/2020    Order Specific Question:   What is the patient's sedation requirement?    Answer:   No Sedation    Order Specific Question:   Does the patient have a pacemaker or implanted devices?    Answer:   No    Order Specific Question:   Preferred imaging location?    Answer:   [facility] (table limit-350lbs)    Order Specific Question:   Radiology Contrast Protocol - do NOT remove file path    Answer:   [email or web address]  . CBC  . COMPLETE METABOLIC PANEL WITH GFR  . Hemoglobin A1c  . Iron, TIBC and Ferritin Panel  . CK  . RPR   Meds ordered this encounter  Medications  . ondansetron (ZOFRAN) 4 MG tablet    Sig: Take 1 tablet (4 mg total) by mouth every 8 (eight) hours as needed for nausea or vomiting.    Dispense:  20 tablet    Refill:  0  . pregabalin (LYRICA) 75 MG capsule    Sig: Take 1 capsule (75 mg total) by  mouth 2 (two) times daily.    Dispense:  60 capsule    Refill:  3     Historical information moved to improve visibility of documentation.  Past Medical History:  Diagnosis Date  . Diabetes (HCC)   . High blood pressure   . 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 ANKLE SURGERY Right 11/19/2015  . HERNIA REPAIR  2019   Social History   Tobacco Use  . Smoking status: Never Smoker  . Smokeless tobacco: Never Used  Substance Use Topics  . Alcohol use: Yes    Comment: 4/wk   family history includes Cancer in her father; Diabetes in her brother, mother, and sister; Heart attack in her mother and sister; High blood pressure in her brother, mother, and sister; Stroke in her maternal grandmother and mother.  Medications: Current Outpatient Medications  Medication Sig Dispense Refill  . BD PEN NEEDLE MICRO U/F 32G X 6 MM MISC USE UNDER THE SKIN DAILY WITH LIRAGLUTIDE 100 each 3  . Cholecalciferol (VITAMIN D) 2000 units CAPS Take by  mouth.    . ciclopirox (PENLAC) 8 % solution APPLY DAILY ON NAIIL AND SURROUNDING SKIN OVER PREVIOUS COAT. AFTER 7 DAYS REMOVE WITH ALCOHOL AND REPEAT CYCLE    . Erenumab-aooe (AIMOVIG) 140 MG/ML SOAJ Inject 140 mg into the skin every 30 (thirty) days. 1 pen 3  . glucose blood (ACCU-CHEK AVIVA) test strip Dx DM E11.9 Check fasting blood sugar every morning and 2 hours after largest meal of the day. 100 each prn  . Glutamic Acid HCl (L-GLUTAMIC ACID PO) Take by mouth.    Marland Kitchen ipratropium-albuterol (DUONEB) 0.5-2.5 (3) MG/3ML SOLN Inhale 3 mLs into the lungs every 4 (four) hours as needed. 360 mL 3  . Lancets 30G MISC Dx DM E11.9 Check fasting blood sugar every morning and 2 hours after largest meal of the day. 100 each prn  . metFORMIN (GLUCOPHAGE XR) 500 MG 24 hr tablet Take 2 tablets (1,000 mg total) by mouth 2 (two) times daily. 360 tablet 3  . olmesartan-hydrochlorothiazide (BENICAR HCT) 20-12.5 MG  tablet TAKE 1 TABLET DAILY 90 tablet 3  . ondansetron (ZOFRAN) 4 MG tablet Take 1 tablet (4 mg total) by mouth every 8 (eight) hours as needed for nausea or vomiting. 20 tablet 0  . pregabalin (LYRICA) 75 MG capsule Take 1 capsule (75 mg total) by mouth 2 (two) times daily. 60 capsule 3  . Probiotic Product (PROBIOTIC-10) CAPS Take by mouth.    . promethazine (PHENERGAN) 25 MG tablet Take 1 tablet (25 mg total) by mouth every 6 (six) hours as needed for nausea or vomiting. 30 tablet 2  . SAXENDA 18 MG/3ML SOPN INJ 3 MG Alice D    . VITAMIN K PO Take by mouth.    . AMBULATORY NON FORMULARY MEDICATION Medication Name: nebulizer machine, tubing and mouthpiece per patient preference and insurance coverage 1 Units prn   No current facility-administered medications for this visit.    Allergies  Allergen Reactions  . Doxycycline Other (See Comments)    Pt states," I don't remember.'    . Gabapentin Other (See Comments)    Slurred speech.     . Propranolol Swelling    GI upset and diarrhea  . Topamax [Topiramate]     Tingling of fingers and toes Altered sense of smell     Discussed warning signs or symptoms. Please see discharge instructions. Patient expresses understanding.

## 2019-09-01 ENCOUNTER — Encounter: Payer: Self-pay | Admitting: Family Medicine

## 2019-09-01 DIAGNOSIS — H40003 Preglaucoma, unspecified, bilateral: Secondary | ICD-10-CM | POA: Diagnosis not present

## 2019-09-01 DIAGNOSIS — D509 Iron deficiency anemia, unspecified: Secondary | ICD-10-CM | POA: Insufficient documentation

## 2019-09-01 DIAGNOSIS — H5213 Myopia, bilateral: Secondary | ICD-10-CM | POA: Diagnosis not present

## 2019-09-01 DIAGNOSIS — H524 Presbyopia: Secondary | ICD-10-CM | POA: Diagnosis not present

## 2019-09-01 DIAGNOSIS — Z7984 Long term (current) use of oral hypoglycemic drugs: Secondary | ICD-10-CM | POA: Diagnosis not present

## 2019-09-01 DIAGNOSIS — F4322 Adjustment disorder with anxiety: Secondary | ICD-10-CM | POA: Diagnosis not present

## 2019-09-01 DIAGNOSIS — H52223 Regular astigmatism, bilateral: Secondary | ICD-10-CM | POA: Diagnosis not present

## 2019-09-01 DIAGNOSIS — H40053 Ocular hypertension, bilateral: Secondary | ICD-10-CM | POA: Diagnosis not present

## 2019-09-01 DIAGNOSIS — E119 Type 2 diabetes mellitus without complications: Secondary | ICD-10-CM | POA: Diagnosis not present

## 2019-09-01 LAB — CBC
HCT: 34.6 % — ABNORMAL LOW (ref 35.0–45.0)
Hemoglobin: 11.3 g/dL — ABNORMAL LOW (ref 11.7–15.5)
MCH: 28.4 pg (ref 27.0–33.0)
MCHC: 32.7 g/dL (ref 32.0–36.0)
MCV: 86.9 fL (ref 80.0–100.0)
MPV: 11.3 fL (ref 7.5–12.5)
Platelets: 354 10*3/uL (ref 140–400)
RBC: 3.98 10*6/uL (ref 3.80–5.10)
RDW: 12.7 % (ref 11.0–15.0)
WBC: 5.5 10*3/uL (ref 3.8–10.8)

## 2019-09-01 LAB — COMPLETE METABOLIC PANEL WITH GFR
AG Ratio: 1.3 (calc) (ref 1.0–2.5)
ALT: 56 U/L — ABNORMAL HIGH (ref 6–29)
AST: 31 U/L (ref 10–35)
Albumin: 4.3 g/dL (ref 3.6–5.1)
Alkaline phosphatase (APISO): 51 U/L (ref 37–153)
BUN: 15 mg/dL (ref 7–25)
CO2: 27 mmol/L (ref 20–32)
Calcium: 9.7 mg/dL (ref 8.6–10.4)
Chloride: 101 mmol/L (ref 98–110)
Creat: 0.65 mg/dL (ref 0.50–1.05)
GFR, Est African American: 120 mL/min/{1.73_m2} (ref >=60)
GFR, Est Non African American: 104 mL/min/{1.73_m2} (ref >=60)
Globulin: 3.3 g/dL (calc) (ref 1.9–3.7)
Glucose, Bld: 118 mg/dL — ABNORMAL HIGH (ref 65–99)
Potassium: 4.2 mmol/L (ref 3.5–5.3)
Sodium: 134 mmol/L — ABNORMAL LOW (ref 135–146)
Total Bilirubin: 0.4 mg/dL (ref 0.2–1.2)
Total Protein: 7.6 g/dL (ref 6.1–8.1)

## 2019-09-01 LAB — IRON,TIBC AND FERRITIN PANEL
%SAT: 13 % (calc) — ABNORMAL LOW (ref 16–45)
Ferritin: 21 ng/mL (ref 16–232)
Iron: 57 ug/dL (ref 45–160)
TIBC: 427 mcg/dL (calc) (ref 250–450)

## 2019-09-01 LAB — CK: Total CK: 56 U/L (ref 29–143)

## 2019-09-01 LAB — RPR: RPR Ser Ql: NONREACTIVE

## 2019-09-01 LAB — HEMOGLOBIN A1C
Hgb A1c MFr Bld: 8.3 % of total Hgb — ABNORMAL HIGH (ref <5.7)
Mean Plasma Glucose: 192 (calc)
eAG (mmol/L): 10.6 (calc)

## 2019-09-01 LAB — HM DIABETES EYE EXAM

## 2019-09-02 DIAGNOSIS — G4733 Obstructive sleep apnea (adult) (pediatric): Secondary | ICD-10-CM | POA: Diagnosis not present

## 2019-09-03 MED ORDER — JARDIANCE 25 MG PO TABS: 25.0000 mg | ORAL_TABLET | Freq: Every day | ORAL | 1 refills | Status: DC

## 2019-09-03 NOTE — Telephone Encounter (Signed)
I checked A1c which was elevated at 8.3.  I discussed with patient and plan to proceed with SGLT2.  We will proceed with Jardiance.  Prescription sent to [facility].  Patient has follow-up appointment soon with her PCP Dr. Sheppard Coil in the near future.

## 2019-09-05 ENCOUNTER — Other Ambulatory Visit: Payer: Self-pay

## 2019-09-05 ENCOUNTER — Ambulatory Visit (INDEPENDENT_AMBULATORY_CARE_PROVIDER_SITE_OTHER): Payer: BC Managed Care – PPO

## 2019-09-05 DIAGNOSIS — E119 Type 2 diabetes mellitus without complications: Secondary | ICD-10-CM

## 2019-09-05 DIAGNOSIS — M791 Myalgia, unspecified site: Secondary | ICD-10-CM | POA: Diagnosis not present

## 2019-09-05 DIAGNOSIS — M542 Cervicalgia: Secondary | ICD-10-CM | POA: Diagnosis not present

## 2019-09-05 DIAGNOSIS — R202 Paresthesia of skin: Secondary | ICD-10-CM

## 2019-09-05 DIAGNOSIS — D649 Anemia, unspecified: Secondary | ICD-10-CM | POA: Diagnosis not present

## 2019-09-05 DIAGNOSIS — R9082 White matter disease, unspecified: Secondary | ICD-10-CM

## 2019-09-06 ENCOUNTER — Encounter: Payer: Self-pay | Admitting: Family Medicine

## 2019-09-06 ENCOUNTER — Ambulatory Visit (INDEPENDENT_AMBULATORY_CARE_PROVIDER_SITE_OTHER): Payer: BC Managed Care – PPO | Admitting: Family Medicine

## 2019-09-06 DIAGNOSIS — M5020 Other cervical disc displacement, unspecified cervical region: Secondary | ICD-10-CM

## 2019-09-06 DIAGNOSIS — M5030 Other cervical disc degeneration, unspecified cervical region: Secondary | ICD-10-CM

## 2019-09-06 DIAGNOSIS — R519 Headache, unspecified: Secondary | ICD-10-CM | POA: Diagnosis not present

## 2019-09-06 NOTE — Progress Notes (Signed)
Virtual Visit  via Video Note  I connected with      Bridget Lawrence by a video enabled telemedicine application and verified that I am speaking with the correct person using two identifiers.   I discussed the limitations of evaluation and management by telemedicine and the availability of in person appointments. The patient expressed understanding and agreed to proceed.  History of Present Illness: Bridget Lawrence is a 48 y.o. female who would like to discuss MRI findings.  Patient continues to experience left-sided pain from her head to her toe including her upper and lower extremity.  She is had some work-up already including evaluation with neurology and metabolic/rheumatologic work-up.  Additionally she had brain MRI which showed only nonspecific white matter changes.  I saw her the other day and broaden the work-up a bit and obtain some more metabolic evaluation showing low iron saturation.  This is treated with oral iron replacement.  Additionally her A1c was not as controlled at 8.3 and Marcelline DeistFarxiga was started.  Additionally however pertinent to today's visit C-spine MRI was obtained showing large bulging disc at C7-T1 worse on right than left.   Observations/Objective: There were no vitals taken for this visit. Wt Readings from Last 5 Encounters:  08/31/19 256 lb (116.1 kg)  08/10/19 254 lb (115.2 kg)  07/13/19 251 lb 14.4 oz (114.3 kg)  06/15/19 259 lb 5 oz (117.6 kg)  06/07/19 251 lb 14.4 oz (114.3 kg)   Exam: Appearance nontoxic Normal Speech.    Lab and Radiology Results No results found for this or any previous visit (from the past 72 hour(s)). Mr Cervical Spine Wo Contrast  Result Date: 09/05/2019 CLINICAL DATA:  Neck pain. Cervicalgia. Anemia. Myalgia. Type 2 diabetes. Paresthesias. White matter abnormality on MRI of the brain. Patient complains of left head, left upper, and left lower extremity pain for 3 months. EXAM: MRI CERVICAL SPINE WITHOUT CONTRAST TECHNIQUE:  Multiplanar, multisequence MR imaging of the cervical spine was performed. No intravenous contrast was administered. COMPARISON:  MR head without contrast 05/31/2019 FINDINGS: Alignment: No significant listhesis is present. There is slight straightening of the normal cervical lordosis. Vertebrae: Marrow signal and vertebral body heights are normal. Cord: Normal signal and morphology. No focal T2 hyperintense lesions are present. Posterior Fossa, vertebral arteries, paraspinal tissues: Craniocervical junction is normal. Flow is present in the vertebral arteries bilaterally. Visualized intracranial contents are normal. Disc levels: C2-3: Negative. C3-4: A rightward disc osteophyte complex is present. There is partial effacement of the ventral CSF. Moderate right foraminal narrowing is present. The left foramen is patent. C4-5: A rightward disc osteophyte complex is present. Asymmetric right-sided uncovertebral spurring is present without significant stenosis. C5-6: A rightward disc osteophyte complex is present. Uncovertebral spurring contributes to mild right foraminal narrowing. The left foramen is patent. C6-7: Negative. C7-T1: A large soft disc protrusion is present. This is asymmetric to the right. There is partial effacement of the ventral CSF. Moderate right foraminal stenosis is present. The left foramen is patent. IMPRESSION: 1. Large soft disc protrusion at C7-T1 with moderate right foraminal stenosis. 2. Mild right foraminal narrowing at C5-6. 3. Moderate right foraminal stenosis at C3-4. 4. Multilevel right-sided disease. No significant left-sided disease in the neck to explain the patient's upper and lower extremity symptoms. Electronically Signed   By: Marin Robertshristopher  Mattern M.D.   On: 09/05/2019 18:09   I personally (independently) visualized and performed the interpretation of the images attached in this note.   Assessment and Plan: 48  y.o. female with  Left-sided pain.  Etiology somewhat unclear.   Likely multifactorial.  Plan to proceed with metabolic treatment including improving iron stores as well as improving diabetes management by adding [place].  Her bulging disc at C7-T1 does not explain the entirety of her pain but could explain some of her left upper extremity symptoms.  I discussed that the next step here would be epidural steroid injection especially if Lyrica is not helpful.  For further work-up to explain her left lower extremity pain next step would likely be MRI L-spine.  Additionally nerve conduction study would be reasonable as well.  After discussion we will await feedback from her sleep medicine doctor to explain parasomnias evaluated the last visit and patient will do some thinking.  Either will proceed with epidural steroid injection or MRI L-spine.  PDMP not reviewed this encounter. No orders of the defined types were placed in this encounter.  No orders of the defined types were placed in this encounter.   Follow Up Instructions:    I discussed the assessment and treatment plan with the patient. The patient was provided an opportunity to ask questions and all were answered. The patient agreed with the plan and demonstrated an understanding of the instructions.   The patient was advised to call back or seek an in-person evaluation if the symptoms worsen or if the condition fails to improve as anticipated.  15 minutes of intraservice time, with >22 minutes of total time during today's visit.      Historical information moved to improve visibility of documentation.  Past Medical History:  Diagnosis Date  . Diabetes (HCC)   . High blood pressure   . 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 ANKLE SURGERY Right 11/19/2015  . HERNIA REPAIR  2019   Social History   Tobacco Use  . Smoking status: Never Smoker  . Smokeless tobacco: Never Used  Substance Use Topics  . Alcohol use: Yes    Comment:  4/wk   family history includes Cancer in her father; Diabetes in her brother, mother, and sister; Heart attack in her mother and sister; High blood pressure in her brother, mother, and sister; Stroke in her maternal grandmother and mother.  Medications: Current Outpatient Medications  Medication Sig Dispense Refill  . AMBULATORY NON FORMULARY MEDICATION Medication Name: nebulizer machine, tubing and mouthpiece per patient preference and insurance coverage 1 Units prn  . BD PEN NEEDLE MICRO U/F 32G X 6 MM MISC USE UNDER THE SKIN DAILY WITH LIRAGLUTIDE 100 each 3  . Cholecalciferol (VITAMIN D) 2000 units CAPS Take by mouth.    . ciclopirox (PENLAC) 8 % solution APPLY DAILY ON NAIIL AND SURROUNDING SKIN OVER PREVIOUS COAT. AFTER 7 DAYS REMOVE WITH ALCOHOL AND REPEAT CYCLE    . empagliflozin (JARDIANCE) 25 MG TABS tablet Take 25 mg by mouth daily before breakfast. 90 tablet 1  . Erenumab-aooe (AIMOVIG) 140 MG/ML SOAJ Inject 140 mg into the skin every 30 (thirty) days. 1 pen 3  . glucose blood (ACCU-CHEK AVIVA) test strip Dx DM E11.9 Check fasting blood sugar every morning and 2 hours after largest meal of the day. 100 each prn  . Glutamic Acid HCl (L-GLUTAMIC ACID PO) Take by mouth.    Marland Kitchen ipratropium-albuterol (DUONEB) 0.5-2.5 (3) MG/3ML SOLN Inhale 3 mLs into the lungs every 4 (four) hours as needed. 360 mL 3  . Lancets 30G MISC Dx DM E11.9 Check fasting blood sugar  every morning and 2 hours after largest meal of the day. 100 each prn  . metFORMIN (GLUCOPHAGE XR) 500 MG 24 hr tablet Take 2 tablets (1,000 mg total) by mouth 2 (two) times daily. 360 tablet 3  . olmesartan-hydrochlorothiazide (BENICAR HCT) 20-12.5 MG tablet TAKE 1 TABLET DAILY 90 tablet 3  . ondansetron (ZOFRAN) 4 MG tablet Take 1 tablet (4 mg total) by mouth every 8 (eight) hours as needed for nausea or vomiting. 20 tablet 0  . pregabalin (LYRICA) 75 MG capsule Take 1 capsule (75 mg total) by mouth 2 (two) times daily. 60 capsule 3  .  Probiotic Product (PROBIOTIC-10) CAPS Take by mouth.    . promethazine (PHENERGAN) 25 MG tablet Take 1 tablet (25 mg total) by mouth every 6 (six) hours as needed for nausea or vomiting. 30 tablet 2  . SAXENDA 18 MG/3ML SOPN INJ 3 MG [place] D    . VITAMIN K PO Take by mouth.     No current facility-administered medications for this visit.    Allergies  Allergen Reactions  . Doxycycline Other (See Comments)    Pt states," I don't remember.'    . Gabapentin Other (See Comments)    Slurred speech.     . Propranolol Swelling    GI upset and diarrhea  . Topamax [Topiramate]     Tingling of fingers and toes Altered sense of smell

## 2019-09-07 ENCOUNTER — Encounter: Payer: Self-pay | Admitting: Family Medicine

## 2019-09-07 DIAGNOSIS — M5030 Other cervical disc degeneration, unspecified cervical region: Secondary | ICD-10-CM

## 2019-09-07 DIAGNOSIS — G4733 Obstructive sleep apnea (adult) (pediatric): Secondary | ICD-10-CM | POA: Diagnosis not present

## 2019-09-07 DIAGNOSIS — M542 Cervicalgia: Secondary | ICD-10-CM

## 2019-09-07 DIAGNOSIS — Z9989 Dependence on other enabling machines and devices: Secondary | ICD-10-CM | POA: Diagnosis not present

## 2019-09-07 DIAGNOSIS — G4710 Hypersomnia, unspecified: Secondary | ICD-10-CM | POA: Diagnosis not present

## 2019-09-07 DIAGNOSIS — R0683 Snoring: Secondary | ICD-10-CM | POA: Diagnosis not present

## 2019-09-07 DIAGNOSIS — M5020 Other cervical disc displacement, unspecified cervical region: Secondary | ICD-10-CM

## 2019-09-07 DIAGNOSIS — E669 Obesity, unspecified: Secondary | ICD-10-CM | POA: Diagnosis not present

## 2019-09-07 DIAGNOSIS — M791 Myalgia, unspecified site: Secondary | ICD-10-CM

## 2019-09-08 ENCOUNTER — Encounter: Payer: Self-pay | Admitting: Osteopathic Medicine

## 2019-09-09 MED ORDER — JARDIANCE 25 MG PO TABS: 25.0000 mg | ORAL_TABLET | Freq: Every day | ORAL | 3 refills | Status: DC

## 2019-09-09 NOTE — Addendum Note (Signed)
Addended by: Gregor Hams on: 09/09/2019 06:27 AM   Modules accepted: Orders

## 2019-09-13 DIAGNOSIS — R519 Headache, unspecified: Secondary | ICD-10-CM | POA: Diagnosis not present

## 2019-09-14 NOTE — Progress Notes (Signed)
PATIENT: Bridget Lawrence DOB: 08/22/1971  REASON FOR VISIT: follow up HISTORY FROM: patient  HISTORY OF PRESENT ILLNESS: Today 09/15/19  Bridget Lawrence is a 48 year old female with history of migraine headache. She was unable to tolerate low-dose Topamax or propanolol due to side effect.  She was started on Aimovig 140 mg monthly injection, along with nortriptyline.  At this point, she has received 2 Aimovig injections.  Prior to starting the injection, she was having daily headache, now on average she may have 1 headache a week.  She indicates that headaches are now less intense.  She did not fill the diclofenac potassium.  For headache treatment, she will use an ice pack and rest.  Her headaches and left-sided pain began in June 2020.  Today, she mostly wants to discuss her current work-up for left-sided pain.  She says the pain is from her left ear to her left toes.  She had MRI of the brain that was unremarkable.  She had MRI of her cervical spine showing a large bulging disc at C7-T1, worse on the right than the left.  Her symptoms are on the left side.  She is to have a cervical epidural injection tomorrow.  She remains on Lyrica.  She presents today for follow-up unaccompanied.  HISTORY 06/15/2019 Dr. Anne HahnWillis: Bridget Lawrence is a 48 year old left-handed black female with a prior history of migraine headaches when she was in her teens through age 48.  At that point, her headaches disappeared.  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  She claims that her sister and a niece also have migraine headache.  The patient has been doing quite well until around 05 May 2019.  The patient woke up with a left-sided headache but also had aching sensations on the entire left side of the body that lasted most of the day.  The patient had associated photophobia and phonophobia and some nausea with the symptoms.  The patient did well for the next week but then had a recurrence of the same event.  Since that time,  the episodes have become more frequent, and now headaches are occurring without the left-sided achy sensations as well.  The patient may be having 3 to 5 headache days a week.  She may have headaches on the left side of the head primarily but occasionally she will have headaches on the right.  She has undergone MRI of the brain that is unremarkable.  She has had a sedimentation rate that was normal at 11.  The patient reports no residual numbness or weakness of the face, arms, legs.  She notes fatigue with the headache and she also notes that if she can get to sleep this will help the headache.  She denies any visual field changes or confusion with the headache.  She is sent to this office for an evaluation.  She has been using Maxalt which did not help her headache and Imitrex does help the headache some but both these medications resulted in tightness sensations of the jaw.   REVIEW OF SYSTEMS: Out of a complete 14 system review of symptoms, the patient complains only of the following symptoms, and all other reviewed systems are negative.  Dizziness, headache, weakness  ALLERGIES: Allergies  Allergen Reactions   Doxycycline Other (See Comments)    Pt states," I don't remember.'     Gabapentin Other (See Comments)    Slurred speech.      Propranolol Swelling    GI upset and diarrhea  Topamax [Topiramate]     Tingling of fingers and toes Altered sense of smell    HOME MEDICATIONS: Outpatient Medications Prior to Visit  Medication Sig Dispense Refill   Cholecalciferol (VITAMIN D) 2000 units CAPS Take by mouth.     ciclopirox (PENLAC) 8 % solution APPLY DAILY ON NAIIL AND SURROUNDING SKIN OVER PREVIOUS COAT. AFTER 7 DAYS REMOVE WITH ALCOHOL AND REPEAT CYCLE     empagliflozin (JARDIANCE) 25 MG TABS tablet Take 25 mg by mouth daily before breakfast. 30 tablet 3   ipratropium-albuterol (DUONEB) 0.5-2.5 (3) MG/3ML SOLN Inhale 3 mLs into the lungs every 4 (four) hours as needed. 360  mL 3   metFORMIN (GLUCOPHAGE XR) 500 MG 24 hr tablet Take 2 tablets (1,000 mg total) by mouth 2 (two) times daily. 360 tablet 3   olmesartan-hydrochlorothiazide (BENICAR HCT) 20-12.5 MG tablet TAKE 1 TABLET DAILY 90 tablet 3   ondansetron (ZOFRAN) 4 MG tablet Take 1 tablet (4 mg total) by mouth every 8 (eight) hours as needed for nausea or vomiting. 20 tablet 0   pregabalin (LYRICA) 75 MG capsule Take 1 capsule (75 mg total) by mouth 2 (two) times daily. 60 capsule 3   Probiotic Product (PROBIOTIC-10) CAPS Take by mouth.     VITAMIN K PO Take by mouth.     Erenumab-aooe (AIMOVIG) 140 MG/ML SOAJ Inject 140 mg into the skin every 30 (thirty) days. 1 pen 3   AMBULATORY NON FORMULARY MEDICATION Medication Name: nebulizer machine, tubing and mouthpiece per patient preference and insurance coverage 1 Units prn   BD PEN NEEDLE MICRO U/F 32G X 6 MM MISC USE UNDER THE SKIN DAILY WITH LIRAGLUTIDE 100 each 3   glucose blood (ACCU-CHEK AVIVA) test strip Dx DM E11.9 Check fasting blood sugar every morning and 2 hours after largest meal of the day. 100 each prn   Glutamic Acid HCl (L-GLUTAMIC ACID PO) Take by mouth.     Lancets 30G MISC Dx DM E11.9 Check fasting blood sugar every morning and 2 hours after largest meal of the day. 100 each prn   promethazine (PHENERGAN) 25 MG tablet Take 1 tablet (25 mg total) by mouth every 6 (six) hours as needed for nausea or vomiting. 30 tablet 2   SAXENDA 18 MG/3ML SOPN INJ 3 MG [place] D     No facility-administered medications prior to visit.     PAST MEDICAL HISTORY: Past Medical History:  Diagnosis Date   Diabetes (HCC)    High blood pressure    Migraine variant with headache 06/15/2019    PAST SURGICAL HISTORY: Past Surgical History:  Procedure Laterality Date   ANKLE SURGERY Right 11/19/2015   HERNIA REPAIR  2019    FAMILY HISTORY: Family History  Problem Relation Age of Onset   Heart attack Mother    Diabetes Mother    High blood  pressure Mother    Stroke Mother    Heart attack Sister    Diabetes Sister    High blood pressure Sister    Diabetes Brother    High blood pressure Brother    Stroke Maternal Grandmother    Cancer Father     SOCIAL HISTORY: Social History   Socioeconomic History   Marital status: Married    Spouse name: Bridget Lawrence   Number of children: 0   Years of education: Not on file   Highest education level: Not on file  Occupational History   Not on file  Social Needs   Financial resource  strain: Not on file   Food insecurity    Worry: Not on file    Inability: Not on file   Transportation needs    Medical: Not on file    Non-medical: Not on file  Tobacco Use   Smoking status: Never Smoker   Smokeless tobacco: Never Used  Substance and Sexual Activity   Alcohol use: Yes    Comment: 4/wk   Drug use: Never   Sexual activity: Yes    Partners: Female    Birth control/protection: None  Lifestyle   Physical activity    Days per week: Not on file    Minutes per session: Not on file   Stress: Not on file  Relationships   Social connections    Talks on phone: Not on file    Gets together: Not on file    Attends religious service: Not on file    Active member of club or organization: Not on file    Attends meetings of clubs or organizations: Not on file    Relationship status: Not on file   Intimate partner violence    Fear of current or ex partner: Not on file    Emotionally abused: Not on file    Physically abused: Not on file    Forced sexual activity: Not on file  Other Topics Concern   Not on file  Social History Narrative   Left handed   Caffeine~ less than half a cup   Lives at home with spouse susan     PHYSICAL EXAM  Vitals:   09/15/19 1443  BP: 124/86  Pulse: 95  Temp: (!) 97.5 F (36.4 C)  TempSrc: Oral  Weight: 254 lb (115.2 kg)  Height: 5\' 7"  (1.702 m)   Body mass index is 39.78 kg/m.  Generalized: Well developed,  in no acute distress   Neurological examination  Mentation: Alert oriented to time, place, history taking. Follows all commands speech and language fluent Cranial nerve II-XII: Pupils were equal round reactive to light. Extraocular movements were full, visual field were full on confrontational test. Facial sensation and strength were normal.  Head turning and shoulder shrug  were normal and symmetric. Motor: The motor testing reveals 5 over 5 strength of all 4 extremities. Good symmetric motor tone is noted throughout.  Sensory: Sensory testing is intact to soft touch on all 4 extremities. No evidence of extinction is noted.  Coordination: Cerebellar testing reveals good finger-nose-finger and heel-to-shin bilaterally.  Gait and station: Gait is normal. Tandem gait is mild unsteady. Romberg is negative. No drift is seen.  Reflexes: Deep tendon reflexes are symmetric and normal bilaterally.   DIAGNOSTIC DATA (LABS, IMAGING, TESTING) - I reviewed patient records, labs, notes, testing and imaging myself where available.  Lab Results  Component Value Date   WBC 5.5 08/31/2019   HGB 11.3 (L) 08/31/2019   HCT 34.6 (L) 08/31/2019   MCV 86.9 08/31/2019   PLT 354 08/31/2019      Component Value Date/Time   NA 134 (L) 08/31/2019 1448   NA 136 07/03/2018 1007   K 4.2 08/31/2019 1448   CL 101 08/31/2019 1448   CO2 27 08/31/2019 1448   GLUCOSE 118 (H) 08/31/2019 1448   BUN 15 08/31/2019 1448   BUN 15 07/03/2018 1007   CREATININE 0.65 08/31/2019 1448   CALCIUM 9.7 08/31/2019 1448   PROT 7.6 08/31/2019 1448   PROT 7.6 07/03/2018 1007   ALBUMIN 4.3 07/03/2018 1007   AST 31 08/31/2019  1448   ALT 56 (H) 08/31/2019 1448   ALKPHOS 59 07/03/2018 1007   BILITOT 0.4 08/31/2019 1448   BILITOT 0.3 07/03/2018 1007   GFRNONAA 104 08/31/2019 1448   GFRAA 120 08/31/2019 1448   Lab Results  Component Value Date   CHOL 174 05/25/2019   HDL 45 (L) 05/25/2019   LDLCALC 103 (H) 05/25/2019   TRIG 165  (H) 05/25/2019   CHOLHDL 3.9 05/25/2019   Lab Results  Component Value Date   HGBA1C 8.3 (H) 08/31/2019   Lab Results  Component Value Date   VITAMINB12 743 08/11/2019   Lab Results  Component Value Date   TSH 1.45 05/25/2019      ASSESSMENT AND PLAN 48 y.o. year old female  has a past medical history of Diabetes (HCC), High blood pressure, and Migraine variant with headache (06/15/2019). here with:  1.  Migraine headache 2.  Left-sided pain since June 2020  Her headaches are currently under excellent control with Aimovig.  She will remain on Aimovig 140 mg monthly injection.  She was having daily headache, is now having on average 1 headache a week, and are less intense.  She is able to relieve the headache with an ice pack and resting.  She is most concerned about her left-sided pain, she is currently undergoing work-up with her primary doctor.  She is scheduled to have a cervical epidural steroid injection tomorrow.  If her pain is not improved, it seems reasonable to pursue nerve conduction study, which can be offered at our office.  She has a follow-up appointment with her primary doctor next week.  She will follow-up in this office in regards to her headaches in 6 months or sooner if needed.   MRI of cervical spine 09/05/2019 IMPRESSION: 1. Large soft disc protrusion at C7-T1 with moderate right foraminal stenosis. 2. Mild right foraminal narrowing at C5-6. 3. Moderate right foraminal stenosis at C3-4. 4. Multilevel right-sided disease. No significant left-sided disease in the neck to explain the patient's upper and lower extremity symptoms.  I spent 25 minutes with the patient. 50% of this time was spent discussing her plan of care.   Margie Ege, AGNP-C, DNP 09/15/2019, 3:26 PM [facility] [address] [place], [place] [address] [phone]

## 2019-09-15 ENCOUNTER — Ambulatory Visit (INDEPENDENT_AMBULATORY_CARE_PROVIDER_SITE_OTHER): Payer: BC Managed Care – PPO | Admitting: Neurology

## 2019-09-15 ENCOUNTER — Other Ambulatory Visit: Payer: Self-pay

## 2019-09-15 ENCOUNTER — Encounter: Payer: Self-pay | Admitting: Neurology

## 2019-09-15 VITALS — BP 124/86 | HR 95 | Temp 97.5°F | Ht 67.0 in | Wt 254.0 lb

## 2019-09-15 DIAGNOSIS — G43809 Other migraine, not intractable, without status migrainosus: Secondary | ICD-10-CM

## 2019-09-15 MED ORDER — AIMOVIG 140 MG/ML ~~LOC~~ SOAJ: 140.0000 mg | SUBCUTANEOUS | 5 refills | Status: DC

## 2019-09-15 NOTE — Progress Notes (Signed)
I have read the note, and I agree with the clinical assessment and plan.  Clotee Schlicker K Tanji Storrs   

## 2019-09-15 NOTE — Patient Instructions (Signed)
Continue Aimovig. If no better after epidural steroid injection, may consider nerve conduction studies, which can be done in our office.

## 2019-09-16 ENCOUNTER — Inpatient Hospital Stay: Admission: RE | Admit: 2019-09-16 | Payer: BC Managed Care – PPO | Source: Ambulatory Visit

## 2019-09-17 DIAGNOSIS — F4322 Adjustment disorder with anxiety: Secondary | ICD-10-CM | POA: Diagnosis not present

## 2019-09-20 DIAGNOSIS — R519 Headache, unspecified: Secondary | ICD-10-CM | POA: Diagnosis not present

## 2019-09-21 ENCOUNTER — Ambulatory Visit: Payer: BC Managed Care – PPO | Admitting: Osteopathic Medicine

## 2019-09-23 ENCOUNTER — Other Ambulatory Visit: Payer: Self-pay

## 2019-09-23 ENCOUNTER — Other Ambulatory Visit: Payer: Self-pay | Admitting: Family Medicine

## 2019-09-23 ENCOUNTER — Ambulatory Visit
Admission: RE | Admit: 2019-09-23 | Discharge: 2019-09-23 | Disposition: A | Discharge status: Home / Self Care | Payer: BC Managed Care – PPO | Source: Ambulatory Visit | Attending: Family Medicine | Admitting: Family Medicine

## 2019-09-23 DIAGNOSIS — M5030 Other cervical disc degeneration, unspecified cervical region: Secondary | ICD-10-CM

## 2019-09-23 DIAGNOSIS — M542 Cervicalgia: Secondary | ICD-10-CM

## 2019-09-23 DIAGNOSIS — M5020 Other cervical disc displacement, unspecified cervical region: Secondary | ICD-10-CM

## 2019-09-23 DIAGNOSIS — M791 Myalgia, unspecified site: Secondary | ICD-10-CM

## 2019-09-27 DIAGNOSIS — R519 Headache, unspecified: Secondary | ICD-10-CM | POA: Diagnosis not present

## 2019-09-28 ENCOUNTER — Ambulatory Visit (INDEPENDENT_AMBULATORY_CARE_PROVIDER_SITE_OTHER): Payer: BC Managed Care – PPO | Admitting: Osteopathic Medicine

## 2019-09-28 ENCOUNTER — Encounter: Payer: Self-pay | Admitting: Family Medicine

## 2019-09-28 ENCOUNTER — Encounter: Payer: Self-pay | Admitting: Osteopathic Medicine

## 2019-09-28 VITALS — BP 126/79 | Temp 97.8°F | Wt 227.0 lb

## 2019-09-28 DIAGNOSIS — R52 Pain, unspecified: Secondary | ICD-10-CM

## 2019-09-28 DIAGNOSIS — G8929 Other chronic pain: Secondary | ICD-10-CM

## 2019-09-28 DIAGNOSIS — G43809 Other migraine, not intractable, without status migrainosus: Secondary | ICD-10-CM | POA: Diagnosis not present

## 2019-09-28 NOTE — Progress Notes (Signed)
Virtual Visit via Video (App used: Doximity) Note  I connected with      Bridget Lawrence on 09/28/19 at 1:30 PM by a telemedicine application and verified that I am speaking with the correct person using two identifiers.  Patient is at home I am in office   I discussed the limitations of evaluation and management by telemedicine and the availability of in person appointments. The patient expressed understanding and agreed to proceed.  History of Present Illness: Bridget Lawrence is a 48 y.o. female who would like to discuss headaches, left sided pain    Reviewed recent visit with neurology 09/15/2019 bout 2 weeks ago.  At that time, reported some improvement on the Aimovig in terms of decreased headache days from daily to once per week.  Allergy planned to continue the Aimovig 140 mg monthly.  Neurology agreed with C-spine injection and if this did not alleviate symptoms it would seem reasonable to pursue a nerve conduction study at their office.  She was set to have a cervical epidural injection 09/16/2019 but decided not to get this done.  Dr. Denyse Amass, who was also consulting on this case, has already reached out to her about this.  C-spine MRI showed large bulging disc at C7-T1 worse on the right, although patient symptoms are on the left side.  Dr. Denyse Amass also mentioned that we might consider MRI of the lumbar spine for leg pain.  I asked patient what the concern was with the cervical spine injection.  She was under the impression from Dr. Denyse Amass that this was something to treat the left-sided pain, even though the lesion was more on the right, it may decrease inflammation in someway and help.  When she spoke with the radiologist, they told her that it was to treat the bulging disc.  She was not particularly comfortable with the disconnect there and decided not to get the injection  Rheumatologic and metabolic work-up largely unremarkable with the exception of iron deficiency anemia (Hgb 11.3,  Ferritin 21, iron supplements were recommended) and suboptimal control of diabetes (A1C 08/31/19 was 8.3, ) and Farxiga was added).     Observations/Objective: BP 126/79 (Patient Position: Sitting, Cuff Size: Normal)   Temp 97.8 F (36.6 C) (Oral)   Wt 227 lb (103 kg)   BMI 35.55 kg/m  BP Readings from Last 3 Encounters:  09/28/19 126/79  09/15/19 124/86  08/31/19 (!) 150/85   Exam: Normal Speech.  NAD  Lab and Radiology Results No results found for this or any previous visit (from the past 72 hour(s)). No results found.     Assessment and Plan: 48 y.o. female with The primary encounter diagnosis was Migraine variant with headache. A diagnosis of Chronic generalized pain was also pertinent to this visit.  Advised we could proceed with MRI of the lumbar spine, would consider nerve conduction study based on those findings.  PDMP not reviewed this encounter. Orders Placed This Encounter  Procedures  . MR Lumbar Spine Wo Contrast    Standing Status:   Future    Standing Expiration Date:   11/27/2020    Order Specific Question:   ** REASON FOR EXAM (FREE TEXT)    Answer:   left lower extremity pain, chornic >6 weeks conservative therapy failed    Order Specific Question:   What is the patient's sedation requirement?    Answer:   No Sedation    Order Specific Question:   Does the patient have a pacemaker or implanted  devices?    Answer:   No    Order Specific Question:   Preferred imaging location?    Answer:   Product[facility]process development scientist (table limit-350lbs)    Order Specific Question:   Radiology Contrast Protocol - do NOT remove file path    Answer:   [email or web address]      Follow Up Instructions: Return for RECHECK LEFT SIDED PAIN PENDING MRI RESULTS / IF WORSE OR CHANGE.    I discussed the assessment and treatment plan with the patient. The patient was provided an opportunity to ask questions and all were answered. The patient agreed with the  plan and demonstrated an understanding of the instructions.   The patient was advised to call back or seek an in-person evaluation if any new concerns, if symptoms worsen or if the condition fails to improve as anticipated.  25 minutes of non-face-to-face time was provided during this encounter.      . . . . . . . . . . . . . Marland Kitchen                   Historical information moved to improve visibility of documentation.  Past Medical History:  Diagnosis Date  . Diabetes ([facility])   . High blood pressure   . 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 ANKLE SURGERY Right 11/19/2015  . HERNIA REPAIR  2019   Social History   Tobacco Use  . Smoking status: Never Smoker  . Smokeless tobacco: Never Used  Substance Use Topics  . Alcohol use: Yes    Comment: 4/wk   family history includes Cancer in her father; Diabetes in her brother, mother, and sister; Heart attack in her mother and sister; High blood pressure in her brother, mother, and sister; Stroke in her maternal grandmother and mother.  Medications: Current Outpatient Medications  Medication Sig Dispense Refill  . Cholecalciferol (VITAMIN D) 2000 units CAPS Take by mouth.    . ciclopirox (PENLAC) 8 % solution APPLY DAILY ON NAIIL AND SURROUNDING SKIN OVER PREVIOUS COAT. AFTER 7 DAYS REMOVE WITH ALCOHOL AND REPEAT CYCLE    . empagliflozin (JARDIANCE) 25 MG TABS tablet Take 25 mg by mouth daily before breakfast. 30 tablet 3  . Erenumab-aooe (AIMOVIG) 140 MG/ML SOAJ Inject 140 mg into the skin every 30 (thirty) days. 1 pen 5  . ipratropium-albuterol (DUONEB) 0.5-2.5 (3) MG/3ML SOLN Inhale 3 mLs into the lungs every 4 (four) hours as needed. 360 mL 3  . metFORMIN (GLUCOPHAGE XR) 500 MG 24 hr tablet Take 2 tablets (1,000 mg total) by mouth 2 (two) times daily. 360 tablet 3  . olmesartan-hydrochlorothiazide (BENICAR HCT) 20-12.5 MG tablet TAKE 1 TABLET DAILY  90 tablet 3  . ondansetron (ZOFRAN) 4 MG tablet Take 1 tablet (4 mg total) by mouth every 8 (eight) hours as needed for nausea or vomiting. 20 tablet 0  . pregabalin (LYRICA) 75 MG capsule Take 1 capsule (75 mg total) by mouth 2 (two) times daily. 60 capsule 3  . Probiotic Product (PROBIOTIC-10) CAPS Take by mouth.    Marland Kitchen VITAMIN K PO Take by mouth.     No current facility-administered medications for this visit.    Allergies  Allergen Reactions  . Doxycycline Other (See Comments)    Pt states," I don't remember.'    . Gabapentin Other (See Comments)    Slurred speech.     . Propranolol Swelling    GI upset  and diarrhea GI upset and diarrhea  . Topiramate Other (See Comments)    Tingling of fingers and toes /altered taste and smell

## 2019-09-29 DIAGNOSIS — F4322 Adjustment disorder with anxiety: Secondary | ICD-10-CM | POA: Diagnosis not present

## 2019-10-03 ENCOUNTER — Other Ambulatory Visit: Payer: Self-pay

## 2019-10-03 ENCOUNTER — Ambulatory Visit (INDEPENDENT_AMBULATORY_CARE_PROVIDER_SITE_OTHER): Payer: BC Managed Care – PPO

## 2019-10-03 DIAGNOSIS — G8929 Other chronic pain: Secondary | ICD-10-CM | POA: Diagnosis not present

## 2019-10-03 DIAGNOSIS — R52 Pain, unspecified: Secondary | ICD-10-CM

## 2019-10-03 DIAGNOSIS — M5126 Other intervertebral disc displacement, lumbar region: Secondary | ICD-10-CM | POA: Diagnosis not present

## 2019-10-03 DIAGNOSIS — M48061 Spinal stenosis, lumbar region without neurogenic claudication: Secondary | ICD-10-CM | POA: Diagnosis not present

## 2019-10-03 DIAGNOSIS — G4733 Obstructive sleep apnea (adult) (pediatric): Secondary | ICD-10-CM | POA: Diagnosis not present

## 2019-10-04 DIAGNOSIS — R519 Headache, unspecified: Secondary | ICD-10-CM | POA: Diagnosis not present

## 2019-10-06 DIAGNOSIS — M9901 Segmental and somatic dysfunction of cervical region: Secondary | ICD-10-CM | POA: Diagnosis not present

## 2019-10-06 DIAGNOSIS — M9903 Segmental and somatic dysfunction of lumbar region: Secondary | ICD-10-CM | POA: Diagnosis not present

## 2019-10-06 DIAGNOSIS — M50322 Other cervical disc degeneration at C5-C6 level: Secondary | ICD-10-CM | POA: Diagnosis not present

## 2019-10-06 DIAGNOSIS — M9902 Segmental and somatic dysfunction of thoracic region: Secondary | ICD-10-CM | POA: Diagnosis not present

## 2019-10-07 ENCOUNTER — Encounter: Payer: Self-pay | Admitting: Osteopathic Medicine

## 2019-10-09 DIAGNOSIS — R519 Headache, unspecified: Secondary | ICD-10-CM | POA: Diagnosis not present

## 2019-10-11 ENCOUNTER — Other Ambulatory Visit: Payer: Self-pay | Admitting: *Deleted

## 2019-10-11 ENCOUNTER — Other Ambulatory Visit: Payer: Self-pay | Admitting: Neurology

## 2019-10-11 MED ORDER — PREGABALIN 75 MG PO CAPS: 75.0000 mg | ORAL_CAPSULE | Freq: Two times a day (BID) | ORAL | 0 refills | Status: DC

## 2019-10-11 MED ORDER — JARDIANCE 25 MG PO TABS: 25.0000 mg | ORAL_TABLET | Freq: Every day | ORAL | 0 refills | Status: DC

## 2019-10-11 MED ORDER — AIMOVIG 140 MG/ML ~~LOC~~ SOAJ: 140.0000 mg | SUBCUTANEOUS | 1 refills | Status: DC

## 2019-10-11 NOTE — Telephone Encounter (Signed)
RX sent 08/31/2019 #[id number] with 3 refills from Dr. Georgina Snell to local pharmacy. Request received from [facility]. Please sign 90 day order.

## 2019-10-18 DIAGNOSIS — R519 Headache, unspecified: Secondary | ICD-10-CM | POA: Diagnosis not present

## 2019-10-23 DIAGNOSIS — R519 Headache, unspecified: Secondary | ICD-10-CM | POA: Diagnosis not present

## 2019-10-27 ENCOUNTER — Encounter: Payer: Self-pay | Admitting: Osteopathic Medicine

## 2019-10-27 ENCOUNTER — Ambulatory Visit (INDEPENDENT_AMBULATORY_CARE_PROVIDER_SITE_OTHER): Payer: BC Managed Care – PPO | Admitting: Osteopathic Medicine

## 2019-10-27 ENCOUNTER — Telehealth: Payer: Self-pay | Admitting: Osteopathic Medicine

## 2019-10-27 VITALS — BP 124/75 | Temp 96.7°F | Wt 246.0 lb

## 2019-10-27 DIAGNOSIS — R52 Pain, unspecified: Secondary | ICD-10-CM

## 2019-10-27 DIAGNOSIS — F4322 Adjustment disorder with anxiety: Secondary | ICD-10-CM | POA: Diagnosis not present

## 2019-10-27 DIAGNOSIS — G8929 Other chronic pain: Secondary | ICD-10-CM | POA: Diagnosis not present

## 2019-10-27 DIAGNOSIS — J019 Acute sinusitis, unspecified: Secondary | ICD-10-CM

## 2019-10-27 MED ORDER — DULOXETINE HCL 20 MG PO CPEP: 20.0000 mg | ORAL_CAPSULE | Freq: Every day | ORAL | 0 refills | Status: DC

## 2019-10-27 MED ORDER — IPRATROPIUM BROMIDE 0.06 % NA SOLN: 2.0000 | Freq: Four times a day (QID) | NASAL | 12 refills | Status: DC

## 2019-10-27 MED ORDER — AMOXICILLIN-POT CLAVULANATE 875-125 MG PO TABS: 1.0000 | ORAL_TABLET | Freq: Two times a day (BID) | ORAL | 0 refills | Status: DC

## 2019-10-27 MED ORDER — FLUCONAZOLE 150 MG PO TABS: 150.0000 mg | ORAL_TABLET | Freq: Once | ORAL | 1 refills | Status: AC

## 2019-10-27 NOTE — Progress Notes (Signed)
Virtual Visit via Video (App used: Doximity) Note  I connected with      Bridget Lawrence on 10/27/19 at 12:58 PM  by a telemedicine application and verified that I am speaking with the correct person using two identifiers.  Patient is at home I am in office   I discussed the limitations of evaluation and management by telemedicine and the availability of in person appointments. The patient expressed understanding and agreed to proceed.  History of Present Illness: Bridget Lawrence is a 48 y.o. female who would like to discuss left sided pain    Longstanding left-sided pain without clear etiology.  Patient has undergone MRI of brain, cervical spine, lumbar spine.  She has some degenerative disc disease/herniated disks, nothing that really would explain her symptoms being only on the left, nothing that would really explain her paresthesia type pain.  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  Fibromyalgia variant has also been considered.  Patient has been scheduled for some injections for the spine, but has deferred these.  Has a follow-up upcoming with spine specialist  3 days of URI type illness w/ cough, drainage, congestion. No OTC meds tried.       Observations/Objective: BP 124/75 Comment: patient reported  Temp (!) 96.7 F (35.9 C) Comment: patient reported  Wt 246 lb (111.6 kg) Comment: patient reported  LMP  (LMP Unknown)   BMI 38.53 kg/m  BP Readings from Last 3 Encounters:  10/27/19 124/75  09/28/19 126/79  09/15/19 124/86   Exam: Normal Speech.  NAD   Lab and Radiology Results No results found for this or any previous visit (from the past 72 hour(s)). No results found.  MRI lumbar spine 10/03/19 IMPRESSION: 1. Broad-based left subarticular disc protrusion at L5-S1, contacting the descending left S1 nerve root. Resultant moderate left lateral recess and foraminal stenosis at this  level related to disc bulge, endplate changes, and facet hypertrophy. 2. Disc bulge with facet hypertrophy at L4-5 with resultant moderate canal and bilateral subarticular stenosis. 3. Mild disc bulge and facet hypertrophy at L3-4 with resultant mild bilateral L3 foraminal stenosis.    Assessment and Plan: 48 y.o. female with The primary encounter diagnosis was Chronic generalized pain. A diagnosis of Acute non-recurrent sinusitis, unspecified location was also pertinent to this visit.  We will treat for sinusitis, see below. Will trial Cymbalta for pain management   PDMP not reviewed this encounter.   No orders of the defined types were placed in this encounter.  Meds ordered this encounter  Medications  . DULoxetine (CYMBALTA) 20 MG capsule    Sig: Take 1 capsule (20 mg total) by mouth daily.    Dispense:  90 capsule    Refill:  0  . ipratropium (ATROVENT) 0.06 % nasal spray    Sig: Place 2 sprays into both nostrils 4 (four) times daily.    Dispense:  15 mL    Refill:  12  . amoxicillin-clavulanate (AUGMENTIN) 875-125 MG tablet    Sig: Take 1 tablet by mouth 2 (two) times daily.    Dispense:  14 tablet    Refill:  0  . fluconazole (DIFLUCAN) 150 MG tablet    Sig: Take 1 tablet (150 mg total) by mouth once for 1 dose. Repeat dose 72 hours if yeast infection persists    Dispense:  2 tablet    Refill:  1     Follow Up  Instructions: Return if symptoms worsen or fail to improve.    I discussed the assessment and treatment plan with the patient. The patient was provided an opportunity to ask questions and all were answered. The patient agreed with the plan and demonstrated an understanding of the instructions.   The patient was advised to call back or seek an in-person evaluation if any new concerns, if symptoms worsen or if the condition fails to improve as anticipated.  25 minutes of non-face-to-face time was provided during this  encounter.      . . . . . . . . . . . . . Marland Kitchen.                   Historical information moved to improve visibility of documentation.  Past Medical History:  Diagnosis Date  . Diabetes (HCC)   . High blood pressure   . 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 ANKLE SURGERY Right 11/19/2015  . HERNIA REPAIR  2019   Social History   Tobacco Use  . Smoking status: Never Smoker  . Smokeless tobacco: Never Used  Substance Use Topics  . Alcohol use: Yes    Comment: 4/wk   family history includes Cancer in her father; Diabetes in her brother, mother, and sister; Heart attack in her mother and sister; High blood pressure in her brother, mother, and sister; Stroke in her maternal grandmother and mother.  Medications: Current Outpatient Medications  Medication Sig Dispense Refill  . Cholecalciferol (VITAMIN D) 2000 units CAPS Take by mouth.    . ciclopirox (PENLAC) 8 % solution APPLY DAILY ON NAIIL AND SURROUNDING SKIN OVER PREVIOUS COAT. AFTER 7 DAYS REMOVE WITH ALCOHOL AND REPEAT CYCLE    . empagliflozin (JARDIANCE) 25 MG TABS tablet Take 25 mg by mouth daily before breakfast. 90 tablet 0  . Erenumab-aooe (AIMOVIG) 140 MG/ML SOAJ Inject 140 mg into the skin every 30 (thirty) days. 3 pen 1  . ipratropium-albuterol (DUONEB) 0.5-2.5 (3) MG/3ML SOLN Inhale 3 mLs into the lungs every 4 (four) hours as needed. 360 mL 3  . metFORMIN (GLUCOPHAGE XR) 500 MG 24 hr tablet Take 2 tablets (1,000 mg total) by mouth 2 (two) times daily. 360 tablet 3  . olmesartan-hydrochlorothiazide (BENICAR HCT) 20-12.5 MG tablet TAKE 1 TABLET DAILY 90 tablet 3  . ondansetron (ZOFRAN) 4 MG tablet Take 1 tablet (4 mg total) by mouth every 8 (eight) hours as needed for nausea or vomiting. 20 tablet 0  . pregabalin (LYRICA) 75 MG capsule Take 1 capsule (75 mg total) by mouth 2 (two) times daily. 180 capsule 0  . Probiotic Product  (PROBIOTIC-10) CAPS Take by mouth.    Marland Kitchen. VITAMIN K PO Take by mouth.    Marland Kitchen. amoxicillin-clavulanate (AUGMENTIN) 875-125 MG tablet Take 1 tablet by mouth 2 (two) times daily. 14 tablet 0  . DULoxetine (CYMBALTA) 20 MG capsule Take 1 capsule (20 mg total) by mouth daily. 90 capsule 0  . ipratropium (ATROVENT) 0.06 % nasal spray Place 2 sprays into both nostrils 4 (four) times daily. 15 mL 12   No current facility-administered medications for this visit.   Allergies  Allergen Reactions  . Doxycycline Other (See Comments)    Pt states," I don't remember.'    . Gabapentin Other (See Comments)    Slurred speech.     . Propranolol Swelling    GI upset and diarrhea GI upset and diarrhea  .  Topiramate Other (See Comments)    Tingling of fingers and toes /altered taste and smell

## 2019-10-27 NOTE — Telephone Encounter (Signed)
Hi Helen, can you please burn a disc for this patient w/ MRI brain, C-spine, L-spine? She will be coming to pick it up probably sometime this week or next. Thanks!

## 2019-10-28 DIAGNOSIS — M9903 Segmental and somatic dysfunction of lumbar region: Secondary | ICD-10-CM | POA: Diagnosis not present

## 2019-10-28 DIAGNOSIS — M9901 Segmental and somatic dysfunction of cervical region: Secondary | ICD-10-CM | POA: Diagnosis not present

## 2019-10-28 DIAGNOSIS — M9902 Segmental and somatic dysfunction of thoracic region: Secondary | ICD-10-CM | POA: Diagnosis not present

## 2019-10-28 DIAGNOSIS — M50322 Other cervical disc degeneration at C5-C6 level: Secondary | ICD-10-CM | POA: Diagnosis not present

## 2019-11-02 DIAGNOSIS — R519 Headache, unspecified: Secondary | ICD-10-CM | POA: Diagnosis not present

## 2019-11-05 DIAGNOSIS — R202 Paresthesia of skin: Secondary | ICD-10-CM | POA: Diagnosis not present

## 2019-11-05 DIAGNOSIS — M5010 Cervical disc disorder with radiculopathy, unspecified cervical region: Secondary | ICD-10-CM | POA: Diagnosis not present

## 2019-11-05 DIAGNOSIS — M5136 Other intervertebral disc degeneration, lumbar region: Secondary | ICD-10-CM | POA: Diagnosis not present

## 2019-11-05 DIAGNOSIS — R269 Unspecified abnormalities of gait and mobility: Secondary | ICD-10-CM | POA: Diagnosis not present

## 2019-11-06 DIAGNOSIS — R519 Headache, unspecified: Secondary | ICD-10-CM | POA: Diagnosis not present

## 2019-11-08 DIAGNOSIS — M9903 Segmental and somatic dysfunction of lumbar region: Secondary | ICD-10-CM | POA: Diagnosis not present

## 2019-11-08 DIAGNOSIS — M9901 Segmental and somatic dysfunction of cervical region: Secondary | ICD-10-CM | POA: Diagnosis not present

## 2019-11-08 DIAGNOSIS — M9902 Segmental and somatic dysfunction of thoracic region: Secondary | ICD-10-CM | POA: Diagnosis not present

## 2019-11-08 DIAGNOSIS — M50322 Other cervical disc degeneration at C5-C6 level: Secondary | ICD-10-CM | POA: Diagnosis not present

## 2019-11-15 DIAGNOSIS — M47814 Spondylosis without myelopathy or radiculopathy, thoracic region: Secondary | ICD-10-CM | POA: Diagnosis not present

## 2019-11-15 DIAGNOSIS — M5124 Other intervertebral disc displacement, thoracic region: Secondary | ICD-10-CM | POA: Diagnosis not present

## 2019-11-15 DIAGNOSIS — M4804 Spinal stenosis, thoracic region: Secondary | ICD-10-CM | POA: Diagnosis not present

## 2019-11-17 DIAGNOSIS — F4322 Adjustment disorder with anxiety: Secondary | ICD-10-CM | POA: Diagnosis not present

## 2019-11-22 DIAGNOSIS — R200 Anesthesia of skin: Secondary | ICD-10-CM | POA: Diagnosis not present

## 2019-11-22 DIAGNOSIS — R202 Paresthesia of skin: Secondary | ICD-10-CM | POA: Diagnosis not present

## 2019-11-22 DIAGNOSIS — M4804 Spinal stenosis, thoracic region: Secondary | ICD-10-CM | POA: Diagnosis not present

## 2019-11-22 DIAGNOSIS — G43719 Chronic migraine without aura, intractable, without status migrainosus: Secondary | ICD-10-CM | POA: Diagnosis not present

## 2019-11-26 DIAGNOSIS — D509 Iron deficiency anemia, unspecified: Secondary | ICD-10-CM | POA: Diagnosis not present

## 2019-11-26 DIAGNOSIS — E559 Vitamin D deficiency, unspecified: Secondary | ICD-10-CM | POA: Diagnosis not present

## 2019-11-26 DIAGNOSIS — M542 Cervicalgia: Secondary | ICD-10-CM | POA: Diagnosis not present

## 2019-11-26 DIAGNOSIS — E1165 Type 2 diabetes mellitus with hyperglycemia: Secondary | ICD-10-CM | POA: Diagnosis not present

## 2019-11-26 DIAGNOSIS — I152 Hypertension secondary to endocrine disorders: Secondary | ICD-10-CM | POA: Diagnosis not present

## 2019-11-26 DIAGNOSIS — R29818 Other symptoms and signs involving the nervous system: Secondary | ICD-10-CM | POA: Diagnosis not present

## 2019-11-26 DIAGNOSIS — E785 Hyperlipidemia, unspecified: Secondary | ICD-10-CM | POA: Diagnosis not present

## 2019-11-26 DIAGNOSIS — R200 Anesthesia of skin: Secondary | ICD-10-CM | POA: Diagnosis not present

## 2019-11-26 DIAGNOSIS — R202 Paresthesia of skin: Secondary | ICD-10-CM | POA: Diagnosis not present

## 2019-11-29 ENCOUNTER — Telehealth: Payer: Self-pay | Admitting: Osteopathic Medicine

## 2019-11-29 NOTE — Telephone Encounter (Signed)
Received fax from [employer] that [employer] requires a PA. Information has been sent to the insurance company. Awaiting determination.   

## 2019-11-30 DIAGNOSIS — R202 Paresthesia of skin: Secondary | ICD-10-CM | POA: Diagnosis not present

## 2019-11-30 DIAGNOSIS — R110 Nausea: Secondary | ICD-10-CM | POA: Diagnosis not present

## 2019-11-30 DIAGNOSIS — R29898 Other symptoms and signs involving the musculoskeletal system: Secondary | ICD-10-CM | POA: Diagnosis not present

## 2019-11-30 DIAGNOSIS — R42 Dizziness and giddiness: Secondary | ICD-10-CM | POA: Diagnosis not present

## 2019-11-30 DIAGNOSIS — R2689 Other abnormalities of gait and mobility: Secondary | ICD-10-CM | POA: Diagnosis not present

## 2019-12-01 NOTE — Telephone Encounter (Signed)
Message from plan: CaseId:[id number];Status:Approved;Review Type:Prior Auth;Coverage Start Date:10/30/2019;Coverage End Date:11/28/2020;

## 2019-12-04 DIAGNOSIS — R519 Headache, unspecified: Secondary | ICD-10-CM | POA: Diagnosis not present

## 2019-12-08 DIAGNOSIS — M4722 Other spondylosis with radiculopathy, cervical region: Secondary | ICD-10-CM | POA: Diagnosis not present

## 2019-12-08 DIAGNOSIS — M546 Pain in thoracic spine: Secondary | ICD-10-CM | POA: Diagnosis not present

## 2019-12-08 DIAGNOSIS — M4804 Spinal stenosis, thoracic region: Secondary | ICD-10-CM | POA: Diagnosis not present

## 2019-12-08 DIAGNOSIS — M545 Low back pain: Secondary | ICD-10-CM | POA: Diagnosis not present

## 2019-12-08 DIAGNOSIS — M5030 Other cervical disc degeneration, unspecified cervical region: Secondary | ICD-10-CM | POA: Diagnosis not present

## 2019-12-08 DIAGNOSIS — M5134 Other intervertebral disc degeneration, thoracic region: Secondary | ICD-10-CM | POA: Diagnosis not present

## 2019-12-10 DIAGNOSIS — R519 Headache, unspecified: Secondary | ICD-10-CM | POA: Diagnosis not present

## 2019-12-10 DIAGNOSIS — M545 Low back pain: Secondary | ICD-10-CM | POA: Diagnosis not present

## 2019-12-10 DIAGNOSIS — M546 Pain in thoracic spine: Secondary | ICD-10-CM | POA: Diagnosis not present

## 2019-12-13 DIAGNOSIS — M546 Pain in thoracic spine: Secondary | ICD-10-CM | POA: Diagnosis not present

## 2019-12-13 DIAGNOSIS — M545 Low back pain: Secondary | ICD-10-CM | POA: Diagnosis not present

## 2019-12-15 DIAGNOSIS — M545 Low back pain: Secondary | ICD-10-CM | POA: Diagnosis not present

## 2019-12-15 DIAGNOSIS — M546 Pain in thoracic spine: Secondary | ICD-10-CM | POA: Diagnosis not present

## 2019-12-15 DIAGNOSIS — F438 Other reactions to severe stress: Secondary | ICD-10-CM | POA: Diagnosis not present

## 2019-12-16 DIAGNOSIS — Z1331 Encounter for screening for depression: Secondary | ICD-10-CM | POA: Diagnosis not present

## 2019-12-16 DIAGNOSIS — E669 Obesity, unspecified: Secondary | ICD-10-CM | POA: Diagnosis not present

## 2019-12-16 DIAGNOSIS — E1169 Type 2 diabetes mellitus with other specified complication: Secondary | ICD-10-CM | POA: Diagnosis not present

## 2019-12-16 DIAGNOSIS — I10 Essential (primary) hypertension: Secondary | ICD-10-CM | POA: Diagnosis not present

## 2019-12-16 DIAGNOSIS — Z6838 Body mass index (BMI) 38.0-38.9, adult: Secondary | ICD-10-CM | POA: Diagnosis not present

## 2019-12-17 DIAGNOSIS — M545 Low back pain: Secondary | ICD-10-CM | POA: Diagnosis not present

## 2019-12-17 DIAGNOSIS — M546 Pain in thoracic spine: Secondary | ICD-10-CM | POA: Diagnosis not present

## 2019-12-20 DIAGNOSIS — M546 Pain in thoracic spine: Secondary | ICD-10-CM | POA: Diagnosis not present

## 2019-12-20 DIAGNOSIS — M545 Low back pain: Secondary | ICD-10-CM | POA: Diagnosis not present

## 2019-12-21 DIAGNOSIS — I10 Essential (primary) hypertension: Secondary | ICD-10-CM | POA: Diagnosis not present

## 2019-12-21 DIAGNOSIS — M4714 Other spondylosis with myelopathy, thoracic region: Secondary | ICD-10-CM | POA: Diagnosis not present

## 2019-12-22 DIAGNOSIS — E668 Other obesity: Secondary | ICD-10-CM | POA: Diagnosis not present

## 2019-12-22 DIAGNOSIS — Z713 Dietary counseling and surveillance: Secondary | ICD-10-CM | POA: Diagnosis not present

## 2019-12-22 DIAGNOSIS — M546 Pain in thoracic spine: Secondary | ICD-10-CM | POA: Diagnosis not present

## 2019-12-22 DIAGNOSIS — K5909 Other constipation: Secondary | ICD-10-CM | POA: Diagnosis not present

## 2019-12-22 DIAGNOSIS — M545 Low back pain: Secondary | ICD-10-CM | POA: Diagnosis not present

## 2019-12-22 DIAGNOSIS — Z7182 Exercise counseling: Secondary | ICD-10-CM | POA: Diagnosis not present

## 2019-12-22 DIAGNOSIS — I10 Essential (primary) hypertension: Secondary | ICD-10-CM | POA: Diagnosis not present

## 2019-12-22 DIAGNOSIS — Z6839 Body mass index (BMI) 39.0-39.9, adult: Secondary | ICD-10-CM | POA: Diagnosis not present

## 2019-12-24 DIAGNOSIS — M545 Low back pain: Secondary | ICD-10-CM | POA: Diagnosis not present

## 2019-12-24 DIAGNOSIS — M546 Pain in thoracic spine: Secondary | ICD-10-CM | POA: Diagnosis not present

## 2019-12-25 DIAGNOSIS — R519 Headache, unspecified: Secondary | ICD-10-CM | POA: Diagnosis not present

## 2019-12-27 DIAGNOSIS — M545 Low back pain: Secondary | ICD-10-CM | POA: Diagnosis not present

## 2019-12-27 DIAGNOSIS — M546 Pain in thoracic spine: Secondary | ICD-10-CM | POA: Diagnosis not present

## 2019-12-29 DIAGNOSIS — M546 Pain in thoracic spine: Secondary | ICD-10-CM | POA: Diagnosis not present

## 2019-12-29 DIAGNOSIS — M545 Low back pain: Secondary | ICD-10-CM | POA: Diagnosis not present

## 2019-12-30 DIAGNOSIS — G8929 Other chronic pain: Secondary | ICD-10-CM | POA: Diagnosis not present

## 2019-12-30 DIAGNOSIS — I10 Essential (primary) hypertension: Secondary | ICD-10-CM | POA: Diagnosis not present

## 2019-12-30 DIAGNOSIS — M542 Cervicalgia: Secondary | ICD-10-CM | POA: Diagnosis not present

## 2019-12-30 DIAGNOSIS — M549 Dorsalgia, unspecified: Secondary | ICD-10-CM | POA: Diagnosis not present

## 2019-12-31 DIAGNOSIS — I10 Essential (primary) hypertension: Secondary | ICD-10-CM | POA: Diagnosis not present

## 2019-12-31 DIAGNOSIS — Z01812 Encounter for preprocedural laboratory examination: Secondary | ICD-10-CM | POA: Diagnosis not present

## 2019-12-31 DIAGNOSIS — E119 Type 2 diabetes mellitus without complications: Secondary | ICD-10-CM | POA: Diagnosis not present

## 2019-12-31 DIAGNOSIS — Z01810 Encounter for preprocedural cardiovascular examination: Secondary | ICD-10-CM | POA: Diagnosis not present

## 2019-12-31 DIAGNOSIS — M545 Low back pain: Secondary | ICD-10-CM | POA: Diagnosis not present

## 2019-12-31 DIAGNOSIS — M549 Dorsalgia, unspecified: Secondary | ICD-10-CM | POA: Diagnosis not present

## 2019-12-31 DIAGNOSIS — M546 Pain in thoracic spine: Secondary | ICD-10-CM | POA: Diagnosis not present

## 2019-12-31 DIAGNOSIS — Z01818 Encounter for other preprocedural examination: Secondary | ICD-10-CM | POA: Diagnosis not present

## 2019-12-31 DIAGNOSIS — M4714 Other spondylosis with myelopathy, thoracic region: Secondary | ICD-10-CM | POA: Diagnosis not present

## 2020-01-01 DIAGNOSIS — M4804 Spinal stenosis, thoracic region: Secondary | ICD-10-CM | POA: Diagnosis not present

## 2020-01-01 DIAGNOSIS — M5134 Other intervertebral disc degeneration, thoracic region: Secondary | ICD-10-CM | POA: Diagnosis not present

## 2020-01-01 DIAGNOSIS — M4714 Other spondylosis with myelopathy, thoracic region: Secondary | ICD-10-CM | POA: Diagnosis not present

## 2020-01-03 DIAGNOSIS — R519 Headache, unspecified: Secondary | ICD-10-CM | POA: Diagnosis not present

## 2020-01-03 DIAGNOSIS — Z01818 Encounter for other preprocedural examination: Secondary | ICD-10-CM | POA: Diagnosis not present

## 2020-01-03 DIAGNOSIS — G4733 Obstructive sleep apnea (adult) (pediatric): Secondary | ICD-10-CM | POA: Diagnosis not present

## 2020-01-03 DIAGNOSIS — M546 Pain in thoracic spine: Secondary | ICD-10-CM | POA: Diagnosis not present

## 2020-01-03 DIAGNOSIS — M545 Low back pain: Secondary | ICD-10-CM | POA: Diagnosis not present

## 2020-01-05 DIAGNOSIS — M5104 Intervertebral disc disorders with myelopathy, thoracic region: Secondary | ICD-10-CM | POA: Diagnosis not present

## 2020-01-05 DIAGNOSIS — F438 Other reactions to severe stress: Secondary | ICD-10-CM | POA: Diagnosis not present

## 2020-01-05 DIAGNOSIS — M545 Low back pain: Secondary | ICD-10-CM | POA: Diagnosis not present

## 2020-01-05 DIAGNOSIS — M546 Pain in thoracic spine: Secondary | ICD-10-CM | POA: Diagnosis not present

## 2020-01-06 DIAGNOSIS — I2699 Other pulmonary embolism without acute cor pulmonale: Secondary | ICD-10-CM | POA: Diagnosis not present

## 2020-01-06 DIAGNOSIS — Z7984 Long term (current) use of oral hypoglycemic drugs: Secondary | ICD-10-CM | POA: Diagnosis not present

## 2020-01-06 DIAGNOSIS — G43809 Other migraine, not intractable, without status migrainosus: Secondary | ICD-10-CM | POA: Diagnosis not present

## 2020-01-06 DIAGNOSIS — Z9989 Dependence on other enabling machines and devices: Secondary | ICD-10-CM | POA: Diagnosis not present

## 2020-01-06 DIAGNOSIS — M79605 Pain in left leg: Secondary | ICD-10-CM | POA: Diagnosis not present

## 2020-01-06 DIAGNOSIS — R339 Retention of urine, unspecified: Secondary | ICD-10-CM | POA: Diagnosis not present

## 2020-01-06 DIAGNOSIS — I2693 Single subsegmental pulmonary embolism without acute cor pulmonale: Secondary | ICD-10-CM | POA: Diagnosis not present

## 2020-01-06 DIAGNOSIS — R000 Tachycardia, unspecified: Secondary | ICD-10-CM | POA: Diagnosis not present

## 2020-01-06 DIAGNOSIS — Z6841 Body Mass Index (BMI) 40.0 and over, adult: Secondary | ICD-10-CM | POA: Diagnosis not present

## 2020-01-06 DIAGNOSIS — M4804 Spinal stenosis, thoracic region: Secondary | ICD-10-CM | POA: Diagnosis not present

## 2020-01-06 DIAGNOSIS — M5124 Other intervertebral disc displacement, thoracic region: Secondary | ICD-10-CM | POA: Diagnosis not present

## 2020-01-06 DIAGNOSIS — G4733 Obstructive sleep apnea (adult) (pediatric): Secondary | ICD-10-CM | POA: Diagnosis not present

## 2020-01-06 DIAGNOSIS — E1165 Type 2 diabetes mellitus with hyperglycemia: Secondary | ICD-10-CM | POA: Diagnosis not present

## 2020-01-06 DIAGNOSIS — I10 Essential (primary) hypertension: Secondary | ICD-10-CM | POA: Diagnosis not present

## 2020-01-06 DIAGNOSIS — Z888 Allergy status to other drugs, medicaments and biological substances status: Secondary | ICD-10-CM | POA: Diagnosis not present

## 2020-01-06 DIAGNOSIS — K5903 Drug induced constipation: Secondary | ICD-10-CM | POA: Diagnosis not present

## 2020-01-06 DIAGNOSIS — Z79899 Other long term (current) drug therapy: Secondary | ICD-10-CM | POA: Diagnosis not present

## 2020-01-06 DIAGNOSIS — M5104 Intervertebral disc disorders with myelopathy, thoracic region: Secondary | ICD-10-CM | POA: Diagnosis not present

## 2020-01-06 DIAGNOSIS — M4714 Other spondylosis with myelopathy, thoracic region: Secondary | ICD-10-CM | POA: Diagnosis not present

## 2020-01-07 DIAGNOSIS — M4714 Other spondylosis with myelopathy, thoracic region: Secondary | ICD-10-CM | POA: Diagnosis not present

## 2020-01-08 DIAGNOSIS — I2693 Single subsegmental pulmonary embolism without acute cor pulmonale: Secondary | ICD-10-CM | POA: Diagnosis not present

## 2020-01-08 DIAGNOSIS — M4714 Other spondylosis with myelopathy, thoracic region: Secondary | ICD-10-CM | POA: Diagnosis not present

## 2020-01-08 DIAGNOSIS — I10 Essential (primary) hypertension: Secondary | ICD-10-CM | POA: Diagnosis not present

## 2020-01-08 DIAGNOSIS — K5903 Drug induced constipation: Secondary | ICD-10-CM | POA: Diagnosis not present

## 2020-01-09 DIAGNOSIS — R000 Tachycardia, unspecified: Secondary | ICD-10-CM | POA: Diagnosis not present

## 2020-01-09 DIAGNOSIS — G4733 Obstructive sleep apnea (adult) (pediatric): Secondary | ICD-10-CM | POA: Diagnosis not present

## 2020-01-09 DIAGNOSIS — M79605 Pain in left leg: Secondary | ICD-10-CM | POA: Diagnosis not present

## 2020-01-09 DIAGNOSIS — I10 Essential (primary) hypertension: Secondary | ICD-10-CM | POA: Diagnosis not present

## 2020-01-09 DIAGNOSIS — M4714 Other spondylosis with myelopathy, thoracic region: Secondary | ICD-10-CM | POA: Diagnosis not present

## 2020-01-13 DIAGNOSIS — Z79899 Other long term (current) drug therapy: Secondary | ICD-10-CM | POA: Diagnosis not present

## 2020-01-13 DIAGNOSIS — T8384XA Pain from genitourinary prosthetic devices, implants and grafts, initial encounter: Secondary | ICD-10-CM | POA: Diagnosis not present

## 2020-01-13 DIAGNOSIS — Z883 Allergy status to other anti-infective agents status: Secondary | ICD-10-CM | POA: Diagnosis not present

## 2020-01-13 DIAGNOSIS — R339 Retention of urine, unspecified: Secondary | ICD-10-CM | POA: Diagnosis not present

## 2020-01-13 DIAGNOSIS — Z7901 Long term (current) use of anticoagulants: Secondary | ICD-10-CM | POA: Diagnosis not present

## 2020-01-13 DIAGNOSIS — R3989 Other symptoms and signs involving the genitourinary system: Secondary | ICD-10-CM | POA: Diagnosis not present

## 2020-01-13 DIAGNOSIS — Z87440 Personal history of urinary (tract) infections: Secondary | ICD-10-CM | POA: Diagnosis not present

## 2020-01-13 DIAGNOSIS — Z888 Allergy status to other drugs, medicaments and biological substances status: Secondary | ICD-10-CM | POA: Diagnosis not present

## 2020-01-13 DIAGNOSIS — I10 Essential (primary) hypertension: Secondary | ICD-10-CM | POA: Diagnosis not present

## 2020-01-13 DIAGNOSIS — T83098A Other mechanical complication of other indwelling urethral catheter, initial encounter: Secondary | ICD-10-CM | POA: Diagnosis not present

## 2020-01-13 DIAGNOSIS — X58XXXA Exposure to other specified factors, initial encounter: Secondary | ICD-10-CM | POA: Diagnosis not present

## 2020-01-13 DIAGNOSIS — E119 Type 2 diabetes mellitus without complications: Secondary | ICD-10-CM | POA: Diagnosis not present

## 2020-01-14 DIAGNOSIS — I10 Essential (primary) hypertension: Secondary | ICD-10-CM | POA: Diagnosis not present

## 2020-01-14 DIAGNOSIS — I2693 Single subsegmental pulmonary embolism without acute cor pulmonale: Secondary | ICD-10-CM | POA: Diagnosis not present

## 2020-01-19 DIAGNOSIS — F438 Other reactions to severe stress: Secondary | ICD-10-CM | POA: Diagnosis not present

## 2020-01-20 DIAGNOSIS — R339 Retention of urine, unspecified: Secondary | ICD-10-CM | POA: Diagnosis not present

## 2020-01-20 DIAGNOSIS — I10 Essential (primary) hypertension: Secondary | ICD-10-CM | POA: Diagnosis not present

## 2020-01-20 DIAGNOSIS — R310 Gross hematuria: Secondary | ICD-10-CM | POA: Diagnosis not present

## 2020-01-21 DIAGNOSIS — I10 Essential (primary) hypertension: Secondary | ICD-10-CM | POA: Diagnosis not present

## 2020-01-21 DIAGNOSIS — R339 Retention of urine, unspecified: Secondary | ICD-10-CM | POA: Diagnosis not present

## 2020-01-24 DIAGNOSIS — I10 Essential (primary) hypertension: Secondary | ICD-10-CM | POA: Diagnosis not present

## 2020-01-24 DIAGNOSIS — R1013 Epigastric pain: Secondary | ICD-10-CM | POA: Diagnosis not present

## 2020-01-28 DIAGNOSIS — E669 Obesity, unspecified: Secondary | ICD-10-CM | POA: Diagnosis not present

## 2020-01-28 DIAGNOSIS — R339 Retention of urine, unspecified: Secondary | ICD-10-CM | POA: Diagnosis not present

## 2020-01-28 DIAGNOSIS — I2693 Single subsegmental pulmonary embolism without acute cor pulmonale: Secondary | ICD-10-CM | POA: Diagnosis not present

## 2020-01-28 DIAGNOSIS — Z9889 Other specified postprocedural states: Secondary | ICD-10-CM | POA: Diagnosis not present

## 2020-01-28 DIAGNOSIS — R1084 Generalized abdominal pain: Secondary | ICD-10-CM | POA: Diagnosis not present

## 2020-02-01 ENCOUNTER — Encounter: Payer: Self-pay | Admitting: Nurse Practitioner

## 2020-02-01 NOTE — Telephone Encounter (Signed)
Lorain Childes, I have removed Lyn Hollingshead from PCP

## 2020-02-02 DIAGNOSIS — R339 Retention of urine, unspecified: Secondary | ICD-10-CM | POA: Diagnosis not present

## 2020-02-02 DIAGNOSIS — F438 Other reactions to severe stress: Secondary | ICD-10-CM | POA: Diagnosis not present

## 2020-02-10 DIAGNOSIS — I152 Hypertension secondary to endocrine disorders: Secondary | ICD-10-CM | POA: Diagnosis not present

## 2020-02-10 DIAGNOSIS — E1165 Type 2 diabetes mellitus with hyperglycemia: Secondary | ICD-10-CM | POA: Diagnosis not present

## 2020-02-10 DIAGNOSIS — I10 Essential (primary) hypertension: Secondary | ICD-10-CM | POA: Diagnosis not present

## 2020-02-10 DIAGNOSIS — M5134 Other intervertebral disc degeneration, thoracic region: Secondary | ICD-10-CM | POA: Diagnosis not present

## 2020-02-10 DIAGNOSIS — B372 Candidiasis of skin and nail: Secondary | ICD-10-CM | POA: Diagnosis not present

## 2020-02-24 DIAGNOSIS — G4733 Obstructive sleep apnea (adult) (pediatric): Secondary | ICD-10-CM | POA: Diagnosis not present

## 2020-03-01 DIAGNOSIS — E119 Type 2 diabetes mellitus without complications: Secondary | ICD-10-CM | POA: Diagnosis not present

## 2020-03-01 DIAGNOSIS — M62838 Other muscle spasm: Secondary | ICD-10-CM | POA: Diagnosis not present

## 2020-03-01 DIAGNOSIS — M545 Low back pain: Secondary | ICD-10-CM | POA: Diagnosis not present

## 2020-03-01 DIAGNOSIS — F438 Other reactions to severe stress: Secondary | ICD-10-CM | POA: Diagnosis not present

## 2020-03-02 DIAGNOSIS — H40003 Preglaucoma, unspecified, bilateral: Secondary | ICD-10-CM | POA: Diagnosis not present

## 2020-03-02 DIAGNOSIS — H40053 Ocular hypertension, bilateral: Secondary | ICD-10-CM | POA: Diagnosis not present

## 2020-03-03 DIAGNOSIS — K581 Irritable bowel syndrome with constipation: Secondary | ICD-10-CM | POA: Diagnosis not present

## 2020-03-03 DIAGNOSIS — I2693 Single subsegmental pulmonary embolism without acute cor pulmonale: Secondary | ICD-10-CM | POA: Diagnosis not present

## 2020-03-03 DIAGNOSIS — R1084 Generalized abdominal pain: Secondary | ICD-10-CM | POA: Diagnosis not present

## 2020-03-03 DIAGNOSIS — E669 Obesity, unspecified: Secondary | ICD-10-CM | POA: Diagnosis not present

## 2020-03-07 DIAGNOSIS — E119 Type 2 diabetes mellitus without complications: Secondary | ICD-10-CM | POA: Diagnosis not present

## 2020-03-07 DIAGNOSIS — M545 Low back pain: Secondary | ICD-10-CM | POA: Diagnosis not present

## 2020-03-08 DIAGNOSIS — M545 Low back pain: Secondary | ICD-10-CM | POA: Diagnosis not present

## 2020-03-08 DIAGNOSIS — E119 Type 2 diabetes mellitus without complications: Secondary | ICD-10-CM | POA: Diagnosis not present

## 2020-03-10 DIAGNOSIS — E119 Type 2 diabetes mellitus without complications: Secondary | ICD-10-CM | POA: Diagnosis not present

## 2020-03-10 DIAGNOSIS — M545 Low back pain: Secondary | ICD-10-CM | POA: Diagnosis not present

## 2020-03-13 DIAGNOSIS — E119 Type 2 diabetes mellitus without complications: Secondary | ICD-10-CM | POA: Diagnosis not present

## 2020-03-13 DIAGNOSIS — M545 Low back pain: Secondary | ICD-10-CM | POA: Diagnosis not present

## 2020-03-15 DIAGNOSIS — E119 Type 2 diabetes mellitus without complications: Secondary | ICD-10-CM | POA: Diagnosis not present

## 2020-03-15 DIAGNOSIS — M545 Low back pain: Secondary | ICD-10-CM | POA: Diagnosis not present

## 2020-03-17 DIAGNOSIS — E119 Type 2 diabetes mellitus without complications: Secondary | ICD-10-CM | POA: Diagnosis not present

## 2020-03-17 DIAGNOSIS — M545 Low back pain: Secondary | ICD-10-CM | POA: Diagnosis not present

## 2020-03-20 DIAGNOSIS — E119 Type 2 diabetes mellitus without complications: Secondary | ICD-10-CM | POA: Diagnosis not present

## 2020-03-20 DIAGNOSIS — M545 Low back pain: Secondary | ICD-10-CM | POA: Diagnosis not present

## 2020-03-22 DIAGNOSIS — F438 Other reactions to severe stress: Secondary | ICD-10-CM | POA: Diagnosis not present

## 2020-03-23 DIAGNOSIS — M545 Low back pain: Secondary | ICD-10-CM | POA: Diagnosis not present

## 2020-03-23 DIAGNOSIS — E119 Type 2 diabetes mellitus without complications: Secondary | ICD-10-CM | POA: Diagnosis not present

## 2020-03-24 DIAGNOSIS — M545 Low back pain: Secondary | ICD-10-CM | POA: Diagnosis not present

## 2020-03-24 DIAGNOSIS — E119 Type 2 diabetes mellitus without complications: Secondary | ICD-10-CM | POA: Diagnosis not present

## 2020-03-27 ENCOUNTER — Ambulatory Visit: Payer: BC Managed Care – PPO | Admitting: Neurology

## 2020-03-27 DIAGNOSIS — E119 Type 2 diabetes mellitus without complications: Secondary | ICD-10-CM | POA: Diagnosis not present

## 2020-03-27 DIAGNOSIS — M545 Low back pain: Secondary | ICD-10-CM | POA: Diagnosis not present

## 2020-03-29 DIAGNOSIS — T3695XA Adverse effect of unspecified systemic antibiotic, initial encounter: Secondary | ICD-10-CM | POA: Diagnosis not present

## 2020-03-29 DIAGNOSIS — K0889 Other specified disorders of teeth and supporting structures: Secondary | ICD-10-CM | POA: Diagnosis not present

## 2020-03-29 DIAGNOSIS — B379 Candidiasis, unspecified: Secondary | ICD-10-CM | POA: Diagnosis not present

## 2020-03-31 DIAGNOSIS — E119 Type 2 diabetes mellitus without complications: Secondary | ICD-10-CM | POA: Diagnosis not present

## 2020-03-31 DIAGNOSIS — M545 Low back pain: Secondary | ICD-10-CM | POA: Diagnosis not present

## 2020-04-03 DIAGNOSIS — M545 Low back pain: Secondary | ICD-10-CM | POA: Diagnosis not present

## 2020-04-03 DIAGNOSIS — E119 Type 2 diabetes mellitus without complications: Secondary | ICD-10-CM | POA: Diagnosis not present

## 2020-04-05 DIAGNOSIS — F438 Other reactions to severe stress: Secondary | ICD-10-CM | POA: Diagnosis not present

## 2020-04-07 DIAGNOSIS — M545 Low back pain: Secondary | ICD-10-CM | POA: Diagnosis not present

## 2020-04-07 DIAGNOSIS — E119 Type 2 diabetes mellitus without complications: Secondary | ICD-10-CM | POA: Diagnosis not present

## 2020-04-10 DIAGNOSIS — M545 Low back pain: Secondary | ICD-10-CM | POA: Diagnosis not present

## 2020-04-10 DIAGNOSIS — M4714 Other spondylosis with myelopathy, thoracic region: Secondary | ICD-10-CM | POA: Diagnosis not present

## 2020-04-10 DIAGNOSIS — E119 Type 2 diabetes mellitus without complications: Secondary | ICD-10-CM | POA: Diagnosis not present

## 2020-04-10 DIAGNOSIS — G43719 Chronic migraine without aura, intractable, without status migrainosus: Secondary | ICD-10-CM | POA: Diagnosis not present

## 2020-04-13 DIAGNOSIS — M545 Low back pain: Secondary | ICD-10-CM | POA: Diagnosis not present

## 2020-04-13 DIAGNOSIS — E119 Type 2 diabetes mellitus without complications: Secondary | ICD-10-CM | POA: Diagnosis not present

## 2020-04-17 DIAGNOSIS — E119 Type 2 diabetes mellitus without complications: Secondary | ICD-10-CM | POA: Diagnosis not present

## 2020-04-17 DIAGNOSIS — M545 Low back pain: Secondary | ICD-10-CM | POA: Diagnosis not present

## 2020-04-18 DIAGNOSIS — Z1231 Encounter for screening mammogram for malignant neoplasm of breast: Secondary | ICD-10-CM | POA: Diagnosis not present

## 2020-04-19 DIAGNOSIS — F438 Other reactions to severe stress: Secondary | ICD-10-CM | POA: Diagnosis not present

## 2020-04-20 DIAGNOSIS — M545 Low back pain: Secondary | ICD-10-CM | POA: Diagnosis not present

## 2020-04-20 DIAGNOSIS — E119 Type 2 diabetes mellitus without complications: Secondary | ICD-10-CM | POA: Diagnosis not present

## 2020-04-20 IMAGING — MR MR CERVICAL SPINE W/O CM
5 series · 33 of 48 positions shown · non-contrast
Comparison: MR head without contrast 05/31/2019

CLINICAL DATA: Neck pain. Cervicalgia. Anemia. Myalgia. Type 2
diabetes. Paresthesias. White matter abnormality on MRI of the
brain. Patient complains of left head, left upper, and left lower
extremity pain for 3 months.

EXAM:
MRI CERVICAL SPINE WITHOUT CONTRAST
TECHNIQUE: Multiplanar, multisequence MR imaging of the cervical spine was
performed. No intravenous contrast was administered.

[Series 3: T1 · sagittal · 3.0mm · 0.70mm/px · 6 of 13 slices shown]
[im 1/13]
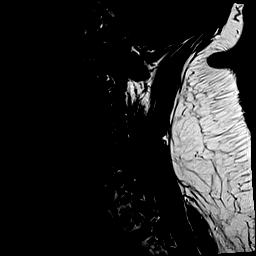
[im 3/13]
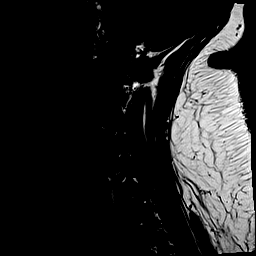
[im 5/13]
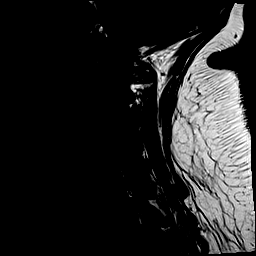
[im 8/13]
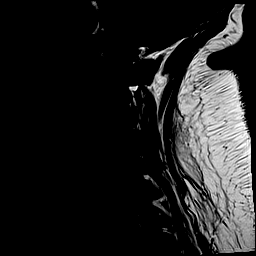
[im 10/13]
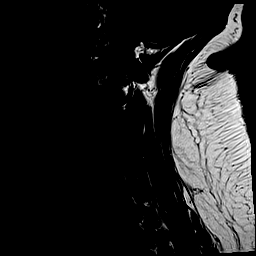
[im 13/13]
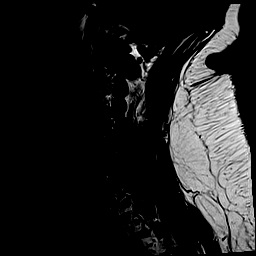

[Series 4: STIR · sagittal · 3.0mm · 0.35mm/px · 6 of 13 slices shown]
[im 1/13]
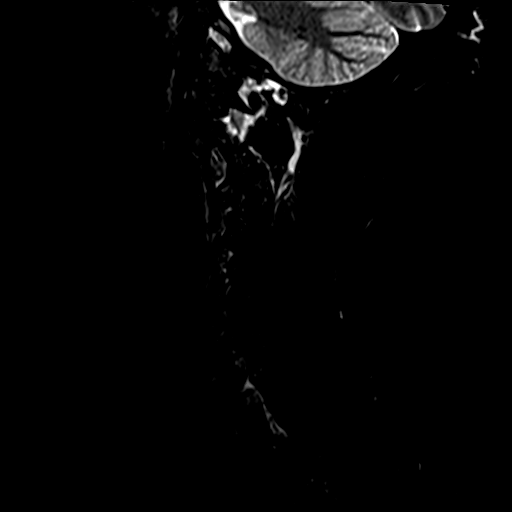
[im 3/13]
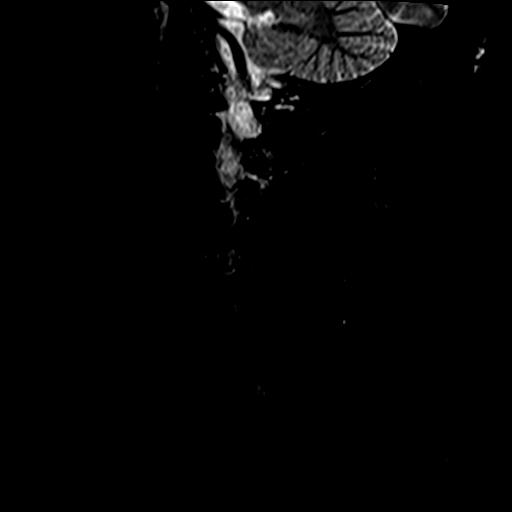
[im 5/13]
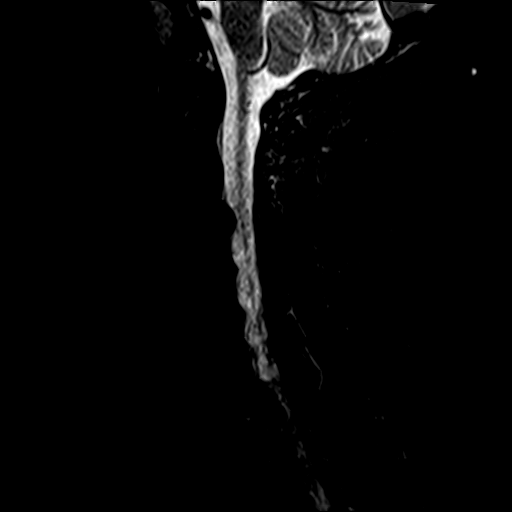
[im 8/13]
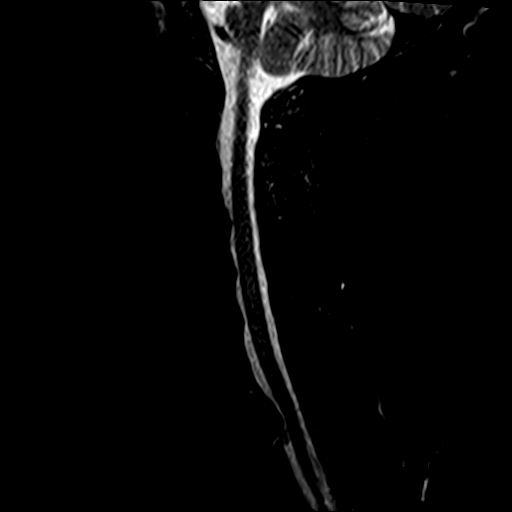
[im 10/13]
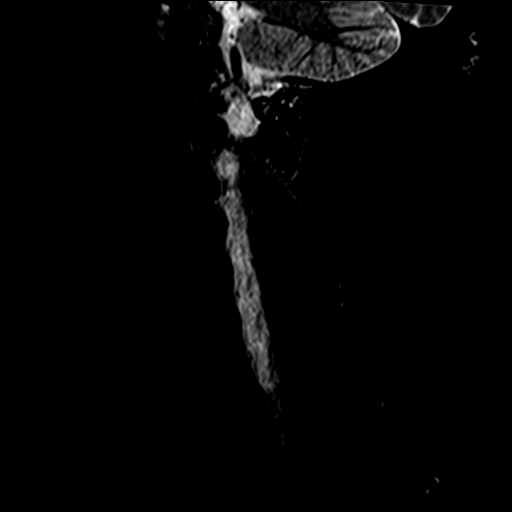
[im 13/13]
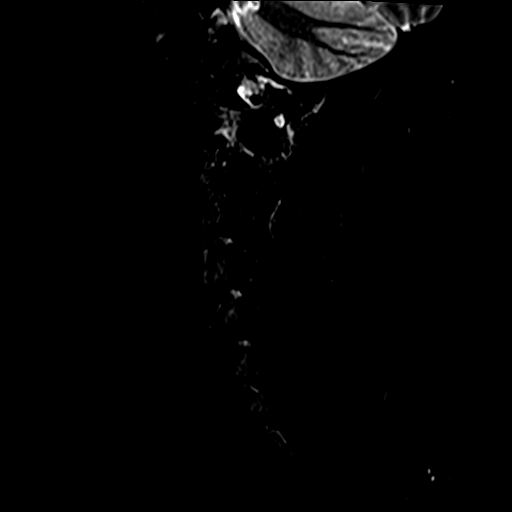

[Series 5: T2 · axial · 3.0mm · 0.62mm/px · z∈[-155,-46]mm · 9 of 30 slices shown (1 of 2)]
[im 1/30]
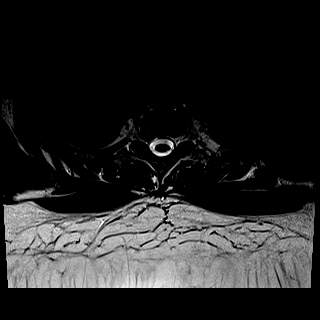
[im 5/30]
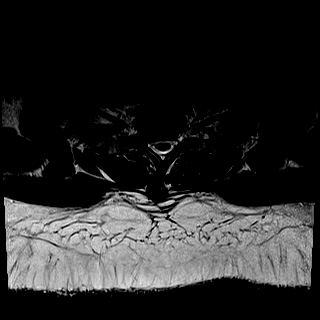
[im 9/30]
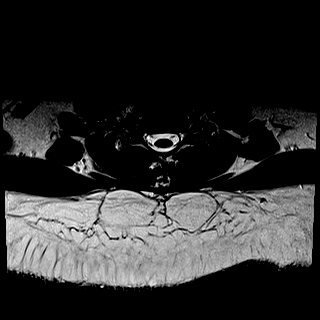
[im 13/30]
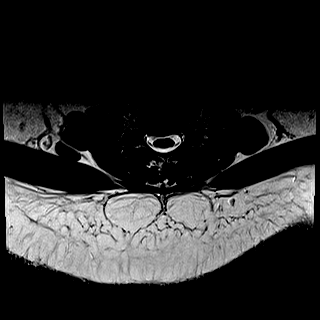
[im 15/30]
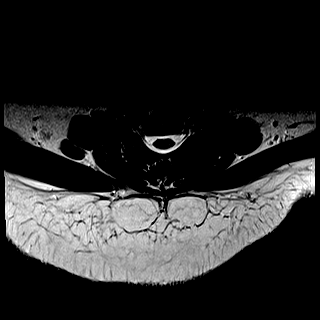
[im 17/30]
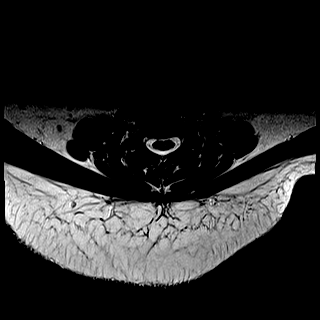
[im 21/30]
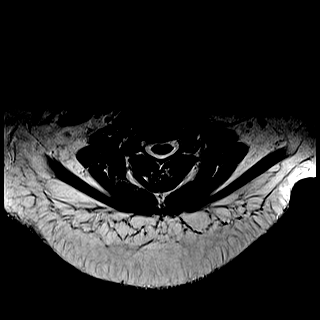
[im 25/30]
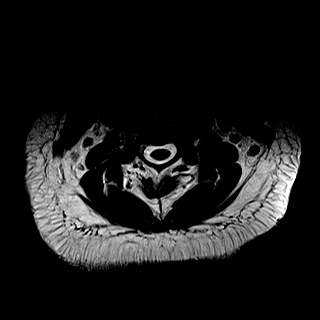
[im 30/30]
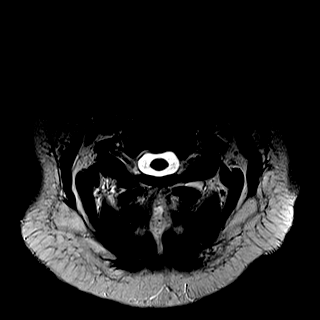

[Series 6: mpgr ax · axial · 3.0mm · 0.35mm/px · z∈[-146,-71]mm · 6 of 30 slices shown]
[im 1/30]
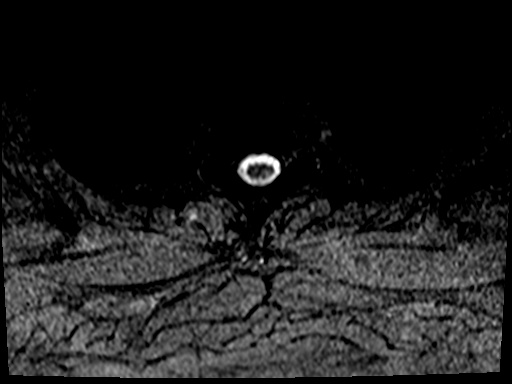
[im 5/30]
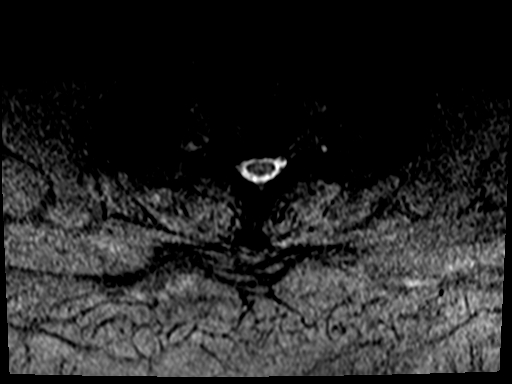
[im 9/30]
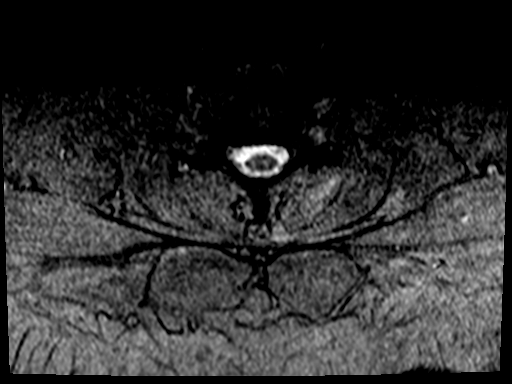
[im 13/30]
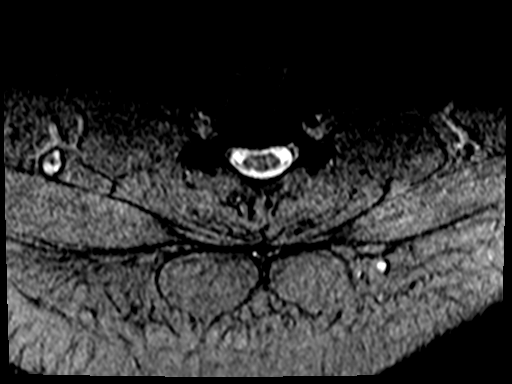
[im 17/30]
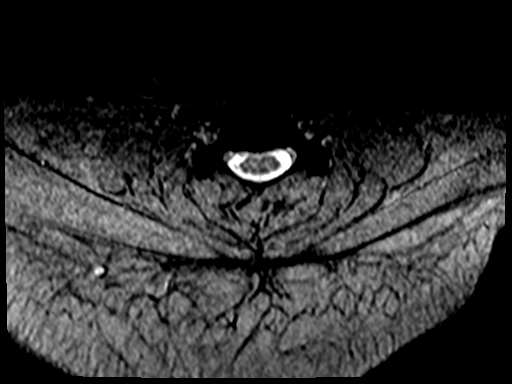
[im 21/30]
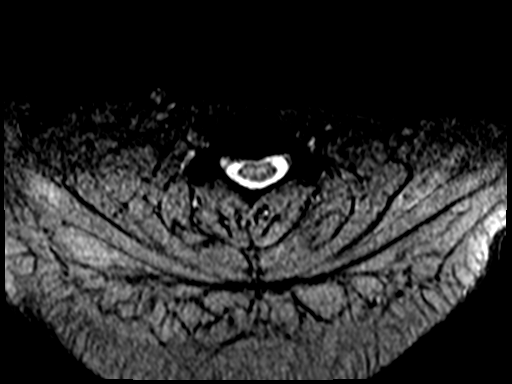

[Series 7: T2 · sagittal · 3.0mm · 0.56mm/px · 6 of 13 slices shown (2 of 2)]
[im 1/13]
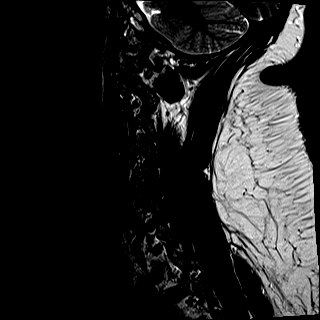
[im 3/13]
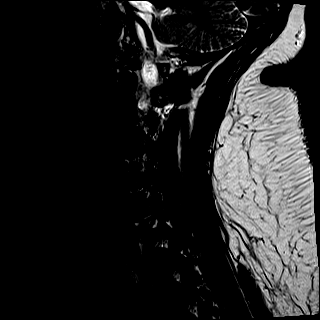
[im 5/13]
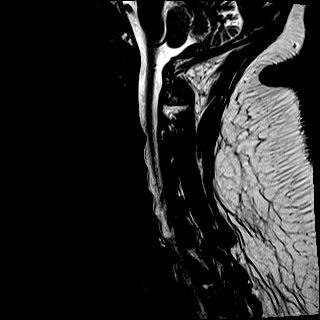
[im 8/13]
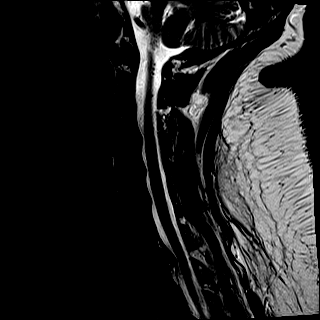
[im 10/13]
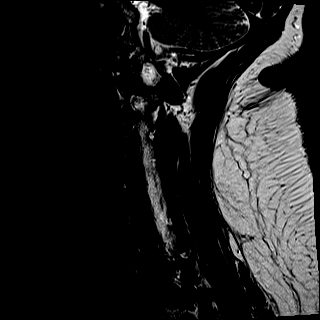
[im 13/13]
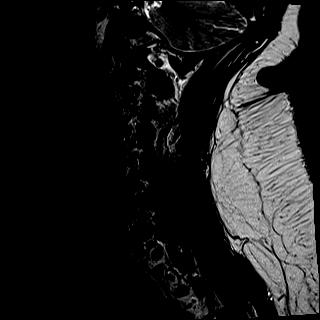

[33 of 48 positions shown; findings below may reference images not displayed]

FINDINGS: Alignment: No significant listhesis is present. There is slight
straightening of the normal cervical lordosis.

Vertebrae: Marrow signal and vertebral body heights are normal.

Cord: Normal signal and morphology. No focal T2 hyperintense lesions
are present.

Posterior Fossa, vertebral arteries, paraspinal tissues:
Craniocervical junction is normal. Flow is present in the vertebral
arteries bilaterally. Visualized intracranial contents are normal.

Disc levels:

C2-3: Negative.

C3-4: A rightward disc osteophyte complex is present. There is
partial effacement of the ventral CSF. Moderate right foraminal
narrowing is present. The left foramen is patent.

C4-5: A rightward disc osteophyte complex is present. Asymmetric
right-sided uncovertebral spurring is present without significant
stenosis.

C5-6: A rightward disc osteophyte complex is present. Uncovertebral
spurring contributes to mild right foraminal narrowing. The left
foramen is patent.

C6-7: Negative.

C7-T1: A large soft disc protrusion is present. This is asymmetric
to the right. There is partial effacement of the ventral CSF.
Moderate right foraminal stenosis is present. The left foramen is
patent.
IMPRESSION: 1. Large soft disc protrusion at C7-T1 with moderate right foraminal
stenosis.
2. Mild right foraminal narrowing at C5-6.
3. Moderate right foraminal stenosis at C3-4.
4. Multilevel right-sided disease. No significant left-sided disease
in the neck to explain the patient's upper and lower extremity
symptoms.

## 2020-04-23 DIAGNOSIS — R002 Palpitations: Secondary | ICD-10-CM | POA: Diagnosis not present

## 2020-04-23 DIAGNOSIS — R7989 Other specified abnormal findings of blood chemistry: Secondary | ICD-10-CM | POA: Diagnosis not present

## 2020-04-23 DIAGNOSIS — I10 Essential (primary) hypertension: Secondary | ICD-10-CM | POA: Diagnosis not present

## 2020-04-23 DIAGNOSIS — R0602 Shortness of breath: Secondary | ICD-10-CM | POA: Diagnosis not present

## 2020-04-23 DIAGNOSIS — R0600 Dyspnea, unspecified: Secondary | ICD-10-CM | POA: Diagnosis not present

## 2020-04-23 DIAGNOSIS — R079 Chest pain, unspecified: Secondary | ICD-10-CM | POA: Diagnosis not present

## 2020-04-23 DIAGNOSIS — Z888 Allergy status to other drugs, medicaments and biological substances status: Secondary | ICD-10-CM | POA: Diagnosis not present

## 2020-04-23 DIAGNOSIS — Z7902 Long term (current) use of antithrombotics/antiplatelets: Secondary | ICD-10-CM | POA: Diagnosis not present

## 2020-04-23 DIAGNOSIS — Z7984 Long term (current) use of oral hypoglycemic drugs: Secondary | ICD-10-CM | POA: Diagnosis not present

## 2020-04-23 DIAGNOSIS — E1165 Type 2 diabetes mellitus with hyperglycemia: Secondary | ICD-10-CM | POA: Diagnosis not present

## 2020-04-23 DIAGNOSIS — R590 Localized enlarged lymph nodes: Secondary | ICD-10-CM | POA: Diagnosis not present

## 2020-04-23 DIAGNOSIS — Z881 Allergy status to other antibiotic agents status: Secondary | ICD-10-CM | POA: Diagnosis not present

## 2020-04-23 DIAGNOSIS — Z79899 Other long term (current) drug therapy: Secondary | ICD-10-CM | POA: Diagnosis not present

## 2020-04-23 DIAGNOSIS — Z86711 Personal history of pulmonary embolism: Secondary | ICD-10-CM | POA: Diagnosis not present

## 2020-04-25 ENCOUNTER — Other Ambulatory Visit: Payer: Self-pay | Admitting: Osteopathic Medicine

## 2020-04-26 DIAGNOSIS — D509 Iron deficiency anemia, unspecified: Secondary | ICD-10-CM | POA: Diagnosis not present

## 2020-04-26 DIAGNOSIS — I2693 Single subsegmental pulmonary embolism without acute cor pulmonale: Secondary | ICD-10-CM | POA: Diagnosis not present

## 2020-04-26 DIAGNOSIS — I10 Essential (primary) hypertension: Secondary | ICD-10-CM | POA: Diagnosis not present

## 2020-04-26 DIAGNOSIS — E1165 Type 2 diabetes mellitus with hyperglycemia: Secondary | ICD-10-CM | POA: Diagnosis not present

## 2020-04-26 DIAGNOSIS — R002 Palpitations: Secondary | ICD-10-CM | POA: Diagnosis not present

## 2020-04-26 DIAGNOSIS — G4733 Obstructive sleep apnea (adult) (pediatric): Secondary | ICD-10-CM | POA: Diagnosis not present

## 2020-04-26 DIAGNOSIS — I152 Hypertension secondary to endocrine disorders: Secondary | ICD-10-CM | POA: Diagnosis not present

## 2020-05-02 DIAGNOSIS — R591 Generalized enlarged lymph nodes: Secondary | ICD-10-CM | POA: Diagnosis not present

## 2020-05-02 DIAGNOSIS — I2699 Other pulmonary embolism without acute cor pulmonale: Secondary | ICD-10-CM | POA: Diagnosis not present

## 2020-05-02 DIAGNOSIS — E119 Type 2 diabetes mellitus without complications: Secondary | ICD-10-CM | POA: Diagnosis not present

## 2020-05-02 DIAGNOSIS — R945 Abnormal results of liver function studies: Secondary | ICD-10-CM | POA: Diagnosis not present

## 2020-05-02 DIAGNOSIS — I2693 Single subsegmental pulmonary embolism without acute cor pulmonale: Secondary | ICD-10-CM | POA: Diagnosis not present

## 2020-05-02 DIAGNOSIS — D649 Anemia, unspecified: Secondary | ICD-10-CM | POA: Diagnosis not present

## 2020-05-02 DIAGNOSIS — Z79899 Other long term (current) drug therapy: Secondary | ICD-10-CM | POA: Diagnosis not present

## 2020-05-02 DIAGNOSIS — I10 Essential (primary) hypertension: Secondary | ICD-10-CM | POA: Diagnosis not present

## 2020-05-04 DIAGNOSIS — M545 Low back pain: Secondary | ICD-10-CM | POA: Diagnosis not present

## 2020-05-04 DIAGNOSIS — E119 Type 2 diabetes mellitus without complications: Secondary | ICD-10-CM | POA: Diagnosis not present

## 2020-05-06 DIAGNOSIS — R519 Headache, unspecified: Secondary | ICD-10-CM | POA: Diagnosis not present

## 2020-05-09 DIAGNOSIS — R519 Headache, unspecified: Secondary | ICD-10-CM | POA: Diagnosis not present

## 2020-05-09 DIAGNOSIS — E119 Type 2 diabetes mellitus without complications: Secondary | ICD-10-CM | POA: Diagnosis not present

## 2020-05-09 DIAGNOSIS — M545 Low back pain: Secondary | ICD-10-CM | POA: Diagnosis not present

## 2020-05-10 DIAGNOSIS — I2699 Other pulmonary embolism without acute cor pulmonale: Secondary | ICD-10-CM | POA: Diagnosis not present

## 2020-05-10 DIAGNOSIS — E1165 Type 2 diabetes mellitus with hyperglycemia: Secondary | ICD-10-CM | POA: Diagnosis not present

## 2020-05-10 DIAGNOSIS — F988 Other specified behavioral and emotional disorders with onset usually occurring in childhood and adolescence: Secondary | ICD-10-CM | POA: Diagnosis not present

## 2020-05-10 DIAGNOSIS — R7989 Other specified abnormal findings of blood chemistry: Secondary | ICD-10-CM | POA: Diagnosis not present

## 2020-05-10 DIAGNOSIS — R252 Cramp and spasm: Secondary | ICD-10-CM | POA: Diagnosis not present

## 2020-05-10 DIAGNOSIS — N921 Excessive and frequent menstruation with irregular cycle: Secondary | ICD-10-CM | POA: Diagnosis not present

## 2020-05-10 DIAGNOSIS — D509 Iron deficiency anemia, unspecified: Secondary | ICD-10-CM | POA: Diagnosis not present

## 2020-05-10 DIAGNOSIS — F438 Other reactions to severe stress: Secondary | ICD-10-CM | POA: Diagnosis not present

## 2020-05-10 DIAGNOSIS — R791 Abnormal coagulation profile: Secondary | ICD-10-CM | POA: Diagnosis not present

## 2020-05-12 DIAGNOSIS — M545 Low back pain: Secondary | ICD-10-CM | POA: Diagnosis not present

## 2020-05-12 DIAGNOSIS — E119 Type 2 diabetes mellitus without complications: Secondary | ICD-10-CM | POA: Diagnosis not present

## 2020-05-16 DIAGNOSIS — R590 Localized enlarged lymph nodes: Secondary | ICD-10-CM | POA: Diagnosis not present

## 2020-05-16 DIAGNOSIS — K760 Fatty (change of) liver, not elsewhere classified: Secondary | ICD-10-CM | POA: Diagnosis not present

## 2020-05-16 DIAGNOSIS — R591 Generalized enlarged lymph nodes: Secondary | ICD-10-CM | POA: Diagnosis not present

## 2020-05-16 DIAGNOSIS — E119 Type 2 diabetes mellitus without complications: Secondary | ICD-10-CM | POA: Diagnosis not present

## 2020-05-16 DIAGNOSIS — K429 Umbilical hernia without obstruction or gangrene: Secondary | ICD-10-CM | POA: Diagnosis not present

## 2020-05-16 DIAGNOSIS — K439 Ventral hernia without obstruction or gangrene: Secondary | ICD-10-CM | POA: Diagnosis not present

## 2020-05-16 DIAGNOSIS — M545 Low back pain: Secondary | ICD-10-CM | POA: Diagnosis not present

## 2020-05-19 DIAGNOSIS — R519 Headache, unspecified: Secondary | ICD-10-CM | POA: Diagnosis not present

## 2020-05-23 DIAGNOSIS — M545 Low back pain: Secondary | ICD-10-CM | POA: Diagnosis not present

## 2020-05-23 DIAGNOSIS — E119 Type 2 diabetes mellitus without complications: Secondary | ICD-10-CM | POA: Diagnosis not present

## 2020-05-24 DIAGNOSIS — Z7902 Long term (current) use of antithrombotics/antiplatelets: Secondary | ICD-10-CM | POA: Diagnosis not present

## 2020-05-24 DIAGNOSIS — R591 Generalized enlarged lymph nodes: Secondary | ICD-10-CM | POA: Diagnosis not present

## 2020-05-24 DIAGNOSIS — R945 Abnormal results of liver function studies: Secondary | ICD-10-CM | POA: Diagnosis not present

## 2020-05-24 DIAGNOSIS — D649 Anemia, unspecified: Secondary | ICD-10-CM | POA: Diagnosis not present

## 2020-05-24 DIAGNOSIS — R590 Localized enlarged lymph nodes: Secondary | ICD-10-CM | POA: Diagnosis not present

## 2020-05-24 DIAGNOSIS — I2699 Other pulmonary embolism without acute cor pulmonale: Secondary | ICD-10-CM | POA: Diagnosis not present

## 2020-05-25 DIAGNOSIS — I10 Essential (primary) hypertension: Secondary | ICD-10-CM | POA: Diagnosis not present

## 2020-05-25 DIAGNOSIS — D509 Iron deficiency anemia, unspecified: Secondary | ICD-10-CM | POA: Diagnosis not present

## 2020-05-25 DIAGNOSIS — E1165 Type 2 diabetes mellitus with hyperglycemia: Secondary | ICD-10-CM | POA: Diagnosis not present

## 2020-05-25 DIAGNOSIS — E785 Hyperlipidemia, unspecified: Secondary | ICD-10-CM | POA: Diagnosis not present

## 2020-05-25 DIAGNOSIS — I152 Hypertension secondary to endocrine disorders: Secondary | ICD-10-CM | POA: Diagnosis not present

## 2020-05-25 DIAGNOSIS — R519 Headache, unspecified: Secondary | ICD-10-CM | POA: Diagnosis not present

## 2020-05-30 DIAGNOSIS — F438 Other reactions to severe stress: Secondary | ICD-10-CM | POA: Diagnosis not present

## 2020-06-01 DIAGNOSIS — F331 Major depressive disorder, recurrent, moderate: Secondary | ICD-10-CM | POA: Diagnosis not present

## 2020-06-06 DIAGNOSIS — M545 Low back pain: Secondary | ICD-10-CM | POA: Diagnosis not present

## 2020-06-06 DIAGNOSIS — E119 Type 2 diabetes mellitus without complications: Secondary | ICD-10-CM | POA: Diagnosis not present

## 2020-06-09 DIAGNOSIS — F438 Other reactions to severe stress: Secondary | ICD-10-CM | POA: Diagnosis not present

## 2020-06-13 ENCOUNTER — Telehealth: Payer: Self-pay | Admitting: *Deleted

## 2020-06-13 NOTE — Telephone Encounter (Signed)
Submitted PA [employer] on CMM. Key: [id number]. Received instant approval: CaseId:[id number];Status:Approved;Review Type:Prior Auth;Coverage Start Date:05/14/2020;Coverage End Date:06/13/2021;

## 2020-06-14 DIAGNOSIS — I10 Essential (primary) hypertension: Secondary | ICD-10-CM | POA: Diagnosis not present

## 2020-06-14 DIAGNOSIS — J0100 Acute maxillary sinusitis, unspecified: Secondary | ICD-10-CM | POA: Diagnosis not present

## 2020-06-14 DIAGNOSIS — H9202 Otalgia, left ear: Secondary | ICD-10-CM | POA: Diagnosis not present

## 2020-06-15 DIAGNOSIS — R519 Headache, unspecified: Secondary | ICD-10-CM | POA: Diagnosis not present

## 2020-06-26 DIAGNOSIS — K581 Irritable bowel syndrome with constipation: Secondary | ICD-10-CM | POA: Diagnosis not present

## 2020-06-26 DIAGNOSIS — K219 Gastro-esophageal reflux disease without esophagitis: Secondary | ICD-10-CM | POA: Diagnosis not present

## 2020-06-26 DIAGNOSIS — R1084 Generalized abdominal pain: Secondary | ICD-10-CM | POA: Diagnosis not present

## 2020-06-27 DIAGNOSIS — N939 Abnormal uterine and vaginal bleeding, unspecified: Secondary | ICD-10-CM | POA: Diagnosis not present

## 2020-06-27 DIAGNOSIS — D500 Iron deficiency anemia secondary to blood loss (chronic): Secondary | ICD-10-CM | POA: Diagnosis not present

## 2020-06-27 DIAGNOSIS — Z86711 Personal history of pulmonary embolism: Secondary | ICD-10-CM | POA: Diagnosis not present

## 2020-06-27 DIAGNOSIS — I10 Essential (primary) hypertension: Secondary | ICD-10-CM | POA: Diagnosis not present

## 2020-06-27 DIAGNOSIS — D259 Leiomyoma of uterus, unspecified: Secondary | ICD-10-CM | POA: Diagnosis not present

## 2020-06-28 DIAGNOSIS — I2699 Other pulmonary embolism without acute cor pulmonale: Secondary | ICD-10-CM | POA: Diagnosis not present

## 2020-06-28 DIAGNOSIS — R911 Solitary pulmonary nodule: Secondary | ICD-10-CM | POA: Diagnosis not present

## 2020-07-03 DIAGNOSIS — G4733 Obstructive sleep apnea (adult) (pediatric): Secondary | ICD-10-CM | POA: Diagnosis not present

## 2020-07-10 DIAGNOSIS — F331 Major depressive disorder, recurrent, moderate: Secondary | ICD-10-CM | POA: Diagnosis not present

## 2020-07-13 DIAGNOSIS — F438 Other reactions to severe stress: Secondary | ICD-10-CM | POA: Diagnosis not present

## 2020-07-26 DIAGNOSIS — G4733 Obstructive sleep apnea (adult) (pediatric): Secondary | ICD-10-CM | POA: Diagnosis not present

## 2020-07-26 DIAGNOSIS — F438 Other reactions to severe stress: Secondary | ICD-10-CM | POA: Diagnosis not present

## 2020-07-27 DIAGNOSIS — E119 Type 2 diabetes mellitus without complications: Secondary | ICD-10-CM | POA: Diagnosis not present

## 2020-07-27 DIAGNOSIS — N921 Excessive and frequent menstruation with irregular cycle: Secondary | ICD-10-CM | POA: Diagnosis not present

## 2020-07-27 DIAGNOSIS — D219 Benign neoplasm of connective and other soft tissue, unspecified: Secondary | ICD-10-CM | POA: Diagnosis not present

## 2020-07-27 DIAGNOSIS — I10 Essential (primary) hypertension: Secondary | ICD-10-CM | POA: Diagnosis not present

## 2020-07-27 DIAGNOSIS — E785 Hyperlipidemia, unspecified: Secondary | ICD-10-CM | POA: Diagnosis not present

## 2020-07-27 DIAGNOSIS — D509 Iron deficiency anemia, unspecified: Secondary | ICD-10-CM | POA: Diagnosis not present

## 2020-07-27 DIAGNOSIS — I152 Hypertension secondary to endocrine disorders: Secondary | ICD-10-CM | POA: Diagnosis not present

## 2020-07-27 DIAGNOSIS — D259 Leiomyoma of uterus, unspecified: Secondary | ICD-10-CM | POA: Diagnosis not present

## 2020-08-08 DIAGNOSIS — Z86711 Personal history of pulmonary embolism: Secondary | ICD-10-CM | POA: Diagnosis not present

## 2020-08-08 DIAGNOSIS — N939 Abnormal uterine and vaginal bleeding, unspecified: Secondary | ICD-10-CM | POA: Diagnosis not present

## 2020-08-08 DIAGNOSIS — I10 Essential (primary) hypertension: Secondary | ICD-10-CM | POA: Diagnosis not present

## 2020-08-08 DIAGNOSIS — M9902 Segmental and somatic dysfunction of thoracic region: Secondary | ICD-10-CM | POA: Diagnosis not present

## 2020-08-08 DIAGNOSIS — E119 Type 2 diabetes mellitus without complications: Secondary | ICD-10-CM | POA: Diagnosis not present

## 2020-08-08 DIAGNOSIS — M9901 Segmental and somatic dysfunction of cervical region: Secondary | ICD-10-CM | POA: Diagnosis not present

## 2020-08-08 DIAGNOSIS — M50322 Other cervical disc degeneration at C5-C6 level: Secondary | ICD-10-CM | POA: Diagnosis not present

## 2020-08-08 DIAGNOSIS — M9903 Segmental and somatic dysfunction of lumbar region: Secondary | ICD-10-CM | POA: Diagnosis not present

## 2020-08-08 DIAGNOSIS — D500 Iron deficiency anemia secondary to blood loss (chronic): Secondary | ICD-10-CM | POA: Diagnosis not present

## 2020-08-08 DIAGNOSIS — Z6841 Body Mass Index (BMI) 40.0 and over, adult: Secondary | ICD-10-CM | POA: Diagnosis not present

## 2020-08-09 DIAGNOSIS — F438 Other reactions to severe stress: Secondary | ICD-10-CM | POA: Diagnosis not present

## 2020-08-11 DIAGNOSIS — M545 Low back pain: Secondary | ICD-10-CM | POA: Diagnosis not present

## 2020-08-11 DIAGNOSIS — M546 Pain in thoracic spine: Secondary | ICD-10-CM | POA: Diagnosis not present

## 2020-08-17 DIAGNOSIS — M9902 Segmental and somatic dysfunction of thoracic region: Secondary | ICD-10-CM | POA: Diagnosis not present

## 2020-08-17 DIAGNOSIS — M9903 Segmental and somatic dysfunction of lumbar region: Secondary | ICD-10-CM | POA: Diagnosis not present

## 2020-08-17 DIAGNOSIS — M50322 Other cervical disc degeneration at C5-C6 level: Secondary | ICD-10-CM | POA: Diagnosis not present

## 2020-08-17 DIAGNOSIS — E119 Type 2 diabetes mellitus without complications: Secondary | ICD-10-CM | POA: Diagnosis not present

## 2020-08-17 DIAGNOSIS — M9901 Segmental and somatic dysfunction of cervical region: Secondary | ICD-10-CM | POA: Diagnosis not present

## 2020-08-17 DIAGNOSIS — M545 Low back pain: Secondary | ICD-10-CM | POA: Diagnosis not present

## 2020-08-21 DIAGNOSIS — M545 Low back pain, unspecified: Secondary | ICD-10-CM | POA: Diagnosis not present

## 2020-08-21 DIAGNOSIS — E119 Type 2 diabetes mellitus without complications: Secondary | ICD-10-CM | POA: Diagnosis not present

## 2020-08-21 DIAGNOSIS — F331 Major depressive disorder, recurrent, moderate: Secondary | ICD-10-CM | POA: Diagnosis not present

## 2020-08-23 DIAGNOSIS — F438 Other reactions to severe stress: Secondary | ICD-10-CM | POA: Diagnosis not present

## 2020-08-25 DIAGNOSIS — E119 Type 2 diabetes mellitus without complications: Secondary | ICD-10-CM | POA: Diagnosis not present

## 2020-08-25 DIAGNOSIS — M545 Low back pain, unspecified: Secondary | ICD-10-CM | POA: Diagnosis not present

## 2020-08-28 DIAGNOSIS — M545 Low back pain, unspecified: Secondary | ICD-10-CM | POA: Diagnosis not present

## 2020-08-28 DIAGNOSIS — E119 Type 2 diabetes mellitus without complications: Secondary | ICD-10-CM | POA: Diagnosis not present

## 2020-08-30 DIAGNOSIS — M545 Low back pain, unspecified: Secondary | ICD-10-CM | POA: Diagnosis not present

## 2020-08-30 DIAGNOSIS — E119 Type 2 diabetes mellitus without complications: Secondary | ICD-10-CM | POA: Diagnosis not present

## 2020-08-31 DIAGNOSIS — E119 Type 2 diabetes mellitus without complications: Secondary | ICD-10-CM | POA: Diagnosis not present

## 2020-08-31 DIAGNOSIS — I10 Essential (primary) hypertension: Secondary | ICD-10-CM | POA: Diagnosis not present

## 2020-08-31 DIAGNOSIS — R000 Tachycardia, unspecified: Secondary | ICD-10-CM | POA: Diagnosis not present

## 2020-08-31 DIAGNOSIS — Z719 Counseling, unspecified: Secondary | ICD-10-CM | POA: Diagnosis not present

## 2020-09-01 DIAGNOSIS — M545 Low back pain, unspecified: Secondary | ICD-10-CM | POA: Diagnosis not present

## 2020-09-01 DIAGNOSIS — E119 Type 2 diabetes mellitus without complications: Secondary | ICD-10-CM | POA: Diagnosis not present

## 2020-09-06 DIAGNOSIS — F438 Other reactions to severe stress: Secondary | ICD-10-CM | POA: Diagnosis not present

## 2020-09-06 DIAGNOSIS — I10 Essential (primary) hypertension: Secondary | ICD-10-CM | POA: Diagnosis not present

## 2020-09-06 DIAGNOSIS — M545 Low back pain, unspecified: Secondary | ICD-10-CM | POA: Diagnosis not present

## 2020-09-06 DIAGNOSIS — E119 Type 2 diabetes mellitus without complications: Secondary | ICD-10-CM | POA: Diagnosis not present

## 2020-09-07 DIAGNOSIS — H40003 Preglaucoma, unspecified, bilateral: Secondary | ICD-10-CM | POA: Diagnosis not present

## 2020-09-07 DIAGNOSIS — H40053 Ocular hypertension, bilateral: Secondary | ICD-10-CM | POA: Diagnosis not present

## 2020-09-07 DIAGNOSIS — H52223 Regular astigmatism, bilateral: Secondary | ICD-10-CM | POA: Diagnosis not present

## 2020-09-07 DIAGNOSIS — H524 Presbyopia: Secondary | ICD-10-CM | POA: Diagnosis not present

## 2020-09-07 DIAGNOSIS — H5213 Myopia, bilateral: Secondary | ICD-10-CM | POA: Diagnosis not present

## 2020-09-07 DIAGNOSIS — E119 Type 2 diabetes mellitus without complications: Secondary | ICD-10-CM | POA: Diagnosis not present

## 2020-09-08 DIAGNOSIS — M545 Low back pain, unspecified: Secondary | ICD-10-CM | POA: Diagnosis not present

## 2020-09-08 DIAGNOSIS — E119 Type 2 diabetes mellitus without complications: Secondary | ICD-10-CM | POA: Diagnosis not present

## 2020-09-12 DIAGNOSIS — E119 Type 2 diabetes mellitus without complications: Secondary | ICD-10-CM | POA: Diagnosis not present

## 2020-09-12 DIAGNOSIS — M545 Low back pain, unspecified: Secondary | ICD-10-CM | POA: Diagnosis not present

## 2020-09-13 DIAGNOSIS — L508 Other urticaria: Secondary | ICD-10-CM | POA: Diagnosis not present

## 2020-09-13 DIAGNOSIS — I152 Hypertension secondary to endocrine disorders: Secondary | ICD-10-CM | POA: Diagnosis not present

## 2020-09-13 DIAGNOSIS — Z8249 Family history of ischemic heart disease and other diseases of the circulatory system: Secondary | ICD-10-CM | POA: Diagnosis not present

## 2020-09-15 DIAGNOSIS — E119 Type 2 diabetes mellitus without complications: Secondary | ICD-10-CM | POA: Diagnosis not present

## 2020-09-15 DIAGNOSIS — I10 Essential (primary) hypertension: Secondary | ICD-10-CM | POA: Diagnosis not present

## 2020-09-15 DIAGNOSIS — R002 Palpitations: Secondary | ICD-10-CM | POA: Diagnosis not present

## 2020-09-15 DIAGNOSIS — R000 Tachycardia, unspecified: Secondary | ICD-10-CM | POA: Diagnosis not present

## 2020-09-15 DIAGNOSIS — M545 Low back pain, unspecified: Secondary | ICD-10-CM | POA: Diagnosis not present

## 2020-09-17 DIAGNOSIS — Z79899 Other long term (current) drug therapy: Secondary | ICD-10-CM | POA: Diagnosis not present

## 2020-09-17 DIAGNOSIS — R29818 Other symptoms and signs involving the nervous system: Secondary | ICD-10-CM | POA: Diagnosis not present

## 2020-09-17 DIAGNOSIS — G43909 Migraine, unspecified, not intractable, without status migrainosus: Secondary | ICD-10-CM | POA: Diagnosis not present

## 2020-09-17 DIAGNOSIS — Z86711 Personal history of pulmonary embolism: Secondary | ICD-10-CM | POA: Diagnosis not present

## 2020-09-17 DIAGNOSIS — R42 Dizziness and giddiness: Secondary | ICD-10-CM | POA: Diagnosis not present

## 2020-09-17 DIAGNOSIS — R2689 Other abnormalities of gait and mobility: Secondary | ICD-10-CM | POA: Diagnosis not present

## 2020-09-17 DIAGNOSIS — Z7952 Long term (current) use of systemic steroids: Secondary | ICD-10-CM | POA: Diagnosis not present

## 2020-09-17 DIAGNOSIS — E1165 Type 2 diabetes mellitus with hyperglycemia: Secondary | ICD-10-CM | POA: Diagnosis not present

## 2020-09-17 DIAGNOSIS — D6489 Other specified anemias: Secondary | ICD-10-CM | POA: Diagnosis not present

## 2020-09-17 DIAGNOSIS — Z888 Allergy status to other drugs, medicaments and biological substances status: Secondary | ICD-10-CM | POA: Diagnosis not present

## 2020-09-17 DIAGNOSIS — Z881 Allergy status to other antibiotic agents status: Secondary | ICD-10-CM | POA: Diagnosis not present

## 2020-09-17 DIAGNOSIS — I10 Essential (primary) hypertension: Secondary | ICD-10-CM | POA: Diagnosis not present

## 2020-09-17 DIAGNOSIS — E876 Hypokalemia: Secondary | ICD-10-CM | POA: Diagnosis not present

## 2020-09-17 DIAGNOSIS — Z6841 Body Mass Index (BMI) 40.0 and over, adult: Secondary | ICD-10-CM | POA: Diagnosis not present

## 2020-09-17 DIAGNOSIS — Z7984 Long term (current) use of oral hypoglycemic drugs: Secondary | ICD-10-CM | POA: Diagnosis not present

## 2020-09-17 DIAGNOSIS — R0602 Shortness of breath: Secondary | ICD-10-CM | POA: Diagnosis not present

## 2020-09-17 DIAGNOSIS — R4182 Altered mental status, unspecified: Secondary | ICD-10-CM | POA: Diagnosis not present

## 2020-09-17 DIAGNOSIS — I6782 Cerebral ischemia: Secondary | ICD-10-CM | POA: Diagnosis not present

## 2020-09-17 DIAGNOSIS — R4701 Aphasia: Secondary | ICD-10-CM | POA: Diagnosis not present

## 2020-09-17 DIAGNOSIS — R197 Diarrhea, unspecified: Secondary | ICD-10-CM | POA: Diagnosis not present

## 2020-09-17 DIAGNOSIS — G4733 Obstructive sleep apnea (adult) (pediatric): Secondary | ICD-10-CM | POA: Diagnosis not present

## 2020-09-17 DIAGNOSIS — Z792 Long term (current) use of antibiotics: Secondary | ICD-10-CM | POA: Diagnosis not present

## 2020-09-17 DIAGNOSIS — R110 Nausea: Secondary | ICD-10-CM | POA: Diagnosis not present

## 2020-09-17 DIAGNOSIS — I160 Hypertensive urgency: Secondary | ICD-10-CM | POA: Diagnosis not present

## 2020-09-17 DIAGNOSIS — R4789 Other speech disturbances: Secondary | ICD-10-CM | POA: Diagnosis not present

## 2020-09-18 DIAGNOSIS — I10 Essential (primary) hypertension: Secondary | ICD-10-CM | POA: Diagnosis not present

## 2020-09-18 DIAGNOSIS — E1165 Type 2 diabetes mellitus with hyperglycemia: Secondary | ICD-10-CM | POA: Diagnosis not present

## 2020-09-18 DIAGNOSIS — I639 Cerebral infarction, unspecified: Secondary | ICD-10-CM | POA: Diagnosis not present

## 2020-09-18 DIAGNOSIS — R42 Dizziness and giddiness: Secondary | ICD-10-CM | POA: Diagnosis not present

## 2020-09-19 DIAGNOSIS — E876 Hypokalemia: Secondary | ICD-10-CM | POA: Diagnosis not present

## 2020-09-19 DIAGNOSIS — R42 Dizziness and giddiness: Secondary | ICD-10-CM | POA: Diagnosis not present

## 2020-09-19 DIAGNOSIS — I160 Hypertensive urgency: Secondary | ICD-10-CM | POA: Diagnosis not present

## 2020-09-19 DIAGNOSIS — G4733 Obstructive sleep apnea (adult) (pediatric): Secondary | ICD-10-CM | POA: Diagnosis not present

## 2020-09-19 DIAGNOSIS — I10 Essential (primary) hypertension: Secondary | ICD-10-CM | POA: Diagnosis not present

## 2020-09-19 DIAGNOSIS — E871 Hypo-osmolality and hyponatremia: Secondary | ICD-10-CM | POA: Diagnosis not present

## 2020-09-20 DIAGNOSIS — I160 Hypertensive urgency: Secondary | ICD-10-CM | POA: Diagnosis not present

## 2020-09-20 DIAGNOSIS — I10 Essential (primary) hypertension: Secondary | ICD-10-CM | POA: Diagnosis not present

## 2020-09-20 DIAGNOSIS — R42 Dizziness and giddiness: Secondary | ICD-10-CM | POA: Diagnosis not present

## 2020-09-20 DIAGNOSIS — G4733 Obstructive sleep apnea (adult) (pediatric): Secondary | ICD-10-CM | POA: Diagnosis not present

## 2020-09-20 DIAGNOSIS — T7840XD Allergy, unspecified, subsequent encounter: Secondary | ICD-10-CM | POA: Diagnosis not present

## 2020-09-20 DIAGNOSIS — E1165 Type 2 diabetes mellitus with hyperglycemia: Secondary | ICD-10-CM | POA: Diagnosis not present

## 2020-09-21 DIAGNOSIS — R002 Palpitations: Secondary | ICD-10-CM | POA: Diagnosis not present

## 2020-09-21 DIAGNOSIS — I10 Essential (primary) hypertension: Secondary | ICD-10-CM | POA: Diagnosis not present

## 2020-09-21 DIAGNOSIS — R000 Tachycardia, unspecified: Secondary | ICD-10-CM | POA: Diagnosis not present

## 2020-09-22 DIAGNOSIS — F438 Other reactions to severe stress: Secondary | ICD-10-CM | POA: Diagnosis not present

## 2020-09-26 DIAGNOSIS — E119 Type 2 diabetes mellitus without complications: Secondary | ICD-10-CM | POA: Diagnosis not present

## 2020-09-26 DIAGNOSIS — M545 Low back pain, unspecified: Secondary | ICD-10-CM | POA: Diagnosis not present

## 2020-09-27 DIAGNOSIS — E1165 Type 2 diabetes mellitus with hyperglycemia: Secondary | ICD-10-CM | POA: Diagnosis not present

## 2020-09-27 DIAGNOSIS — R202 Paresthesia of skin: Secondary | ICD-10-CM | POA: Diagnosis not present

## 2020-09-27 DIAGNOSIS — M542 Cervicalgia: Secondary | ICD-10-CM | POA: Diagnosis not present

## 2020-09-27 DIAGNOSIS — I152 Hypertension secondary to endocrine disorders: Secondary | ICD-10-CM | POA: Diagnosis not present

## 2020-09-28 DIAGNOSIS — I10 Essential (primary) hypertension: Secondary | ICD-10-CM | POA: Diagnosis not present

## 2020-09-28 DIAGNOSIS — M4714 Other spondylosis with myelopathy, thoracic region: Secondary | ICD-10-CM | POA: Diagnosis not present

## 2020-09-28 DIAGNOSIS — R42 Dizziness and giddiness: Secondary | ICD-10-CM | POA: Diagnosis not present

## 2020-09-28 DIAGNOSIS — R52 Pain, unspecified: Secondary | ICD-10-CM | POA: Diagnosis not present

## 2020-10-02 DIAGNOSIS — M545 Low back pain, unspecified: Secondary | ICD-10-CM | POA: Diagnosis not present

## 2020-10-02 DIAGNOSIS — G4733 Obstructive sleep apnea (adult) (pediatric): Secondary | ICD-10-CM | POA: Diagnosis not present

## 2020-10-02 DIAGNOSIS — E119 Type 2 diabetes mellitus without complications: Secondary | ICD-10-CM | POA: Diagnosis not present

## 2020-10-04 DIAGNOSIS — F438 Other reactions to severe stress: Secondary | ICD-10-CM | POA: Diagnosis not present

## 2020-10-05 DIAGNOSIS — R002 Palpitations: Secondary | ICD-10-CM | POA: Diagnosis not present

## 2020-10-05 DIAGNOSIS — H8112 Benign paroxysmal vertigo, left ear: Secondary | ICD-10-CM | POA: Diagnosis not present

## 2020-10-05 DIAGNOSIS — R42 Dizziness and giddiness: Secondary | ICD-10-CM | POA: Diagnosis not present

## 2020-10-05 DIAGNOSIS — R000 Tachycardia, unspecified: Secondary | ICD-10-CM | POA: Diagnosis not present

## 2020-10-05 DIAGNOSIS — I10 Essential (primary) hypertension: Secondary | ICD-10-CM | POA: Diagnosis not present

## 2020-10-06 DIAGNOSIS — E119 Type 2 diabetes mellitus without complications: Secondary | ICD-10-CM | POA: Diagnosis not present

## 2020-10-06 DIAGNOSIS — M545 Low back pain, unspecified: Secondary | ICD-10-CM | POA: Diagnosis not present

## 2020-10-10 DIAGNOSIS — H8112 Benign paroxysmal vertigo, left ear: Secondary | ICD-10-CM | POA: Diagnosis not present

## 2020-10-10 DIAGNOSIS — H8190 Unspecified disorder of vestibular function, unspecified ear: Secondary | ICD-10-CM | POA: Diagnosis not present

## 2020-10-11 DIAGNOSIS — E119 Type 2 diabetes mellitus without complications: Secondary | ICD-10-CM | POA: Diagnosis not present

## 2020-10-11 DIAGNOSIS — N939 Abnormal uterine and vaginal bleeding, unspecified: Secondary | ICD-10-CM | POA: Diagnosis not present

## 2020-10-11 DIAGNOSIS — H8112 Benign paroxysmal vertigo, left ear: Secondary | ICD-10-CM | POA: Diagnosis not present

## 2020-10-11 DIAGNOSIS — Z7984 Long term (current) use of oral hypoglycemic drugs: Secondary | ICD-10-CM | POA: Diagnosis not present

## 2020-10-11 DIAGNOSIS — I10 Essential (primary) hypertension: Secondary | ICD-10-CM | POA: Diagnosis not present

## 2020-10-11 DIAGNOSIS — D252 Subserosal leiomyoma of uterus: Secondary | ICD-10-CM | POA: Diagnosis not present

## 2020-10-11 DIAGNOSIS — R42 Dizziness and giddiness: Secondary | ICD-10-CM | POA: Diagnosis not present

## 2020-10-11 DIAGNOSIS — D251 Intramural leiomyoma of uterus: Secondary | ICD-10-CM | POA: Diagnosis not present

## 2020-10-11 DIAGNOSIS — Z6841 Body Mass Index (BMI) 40.0 and over, adult: Secondary | ICD-10-CM | POA: Diagnosis not present

## 2020-10-11 DIAGNOSIS — D500 Iron deficiency anemia secondary to blood loss (chronic): Secondary | ICD-10-CM | POA: Diagnosis not present

## 2020-10-11 DIAGNOSIS — D250 Submucous leiomyoma of uterus: Secondary | ICD-10-CM | POA: Diagnosis not present

## 2020-10-11 DIAGNOSIS — Z01812 Encounter for preprocedural laboratory examination: Secondary | ICD-10-CM | POA: Diagnosis not present

## 2020-10-11 DIAGNOSIS — Z86711 Personal history of pulmonary embolism: Secondary | ICD-10-CM | POA: Diagnosis not present

## 2020-10-11 DIAGNOSIS — Z79899 Other long term (current) drug therapy: Secondary | ICD-10-CM | POA: Diagnosis not present

## 2020-10-13 DIAGNOSIS — E119 Type 2 diabetes mellitus without complications: Secondary | ICD-10-CM | POA: Diagnosis not present

## 2020-10-13 DIAGNOSIS — M545 Low back pain, unspecified: Secondary | ICD-10-CM | POA: Diagnosis not present

## 2020-10-16 DIAGNOSIS — M545 Low back pain, unspecified: Secondary | ICD-10-CM | POA: Diagnosis not present

## 2020-10-16 DIAGNOSIS — E119 Type 2 diabetes mellitus without complications: Secondary | ICD-10-CM | POA: Diagnosis not present

## 2020-10-17 DIAGNOSIS — Z86711 Personal history of pulmonary embolism: Secondary | ICD-10-CM | POA: Diagnosis not present

## 2020-10-17 DIAGNOSIS — I10 Essential (primary) hypertension: Secondary | ICD-10-CM | POA: Diagnosis not present

## 2020-10-17 DIAGNOSIS — G43909 Migraine, unspecified, not intractable, without status migrainosus: Secondary | ICD-10-CM | POA: Diagnosis not present

## 2020-10-17 DIAGNOSIS — Z881 Allergy status to other antibiotic agents status: Secondary | ICD-10-CM | POA: Diagnosis not present

## 2020-10-17 DIAGNOSIS — G4733 Obstructive sleep apnea (adult) (pediatric): Secondary | ICD-10-CM | POA: Diagnosis not present

## 2020-10-17 DIAGNOSIS — D259 Leiomyoma of uterus, unspecified: Secondary | ICD-10-CM | POA: Diagnosis not present

## 2020-10-17 DIAGNOSIS — N939 Abnormal uterine and vaginal bleeding, unspecified: Secondary | ICD-10-CM | POA: Diagnosis not present

## 2020-10-17 DIAGNOSIS — R000 Tachycardia, unspecified: Secondary | ICD-10-CM | POA: Diagnosis not present

## 2020-10-17 DIAGNOSIS — D509 Iron deficiency anemia, unspecified: Secondary | ICD-10-CM | POA: Diagnosis not present

## 2020-10-17 DIAGNOSIS — Z6839 Body mass index (BMI) 39.0-39.9, adult: Secondary | ICD-10-CM | POA: Diagnosis not present

## 2020-10-17 DIAGNOSIS — D252 Subserosal leiomyoma of uterus: Secondary | ICD-10-CM | POA: Diagnosis not present

## 2020-10-17 DIAGNOSIS — D250 Submucous leiomyoma of uterus: Secondary | ICD-10-CM | POA: Diagnosis not present

## 2020-10-17 DIAGNOSIS — E669 Obesity, unspecified: Secondary | ICD-10-CM | POA: Diagnosis not present

## 2020-10-17 DIAGNOSIS — E7800 Pure hypercholesterolemia, unspecified: Secondary | ICD-10-CM | POA: Diagnosis not present

## 2020-10-17 DIAGNOSIS — D500 Iron deficiency anemia secondary to blood loss (chronic): Secondary | ICD-10-CM | POA: Diagnosis not present

## 2020-10-17 DIAGNOSIS — E119 Type 2 diabetes mellitus without complications: Secondary | ICD-10-CM | POA: Diagnosis not present

## 2020-10-17 DIAGNOSIS — D251 Intramural leiomyoma of uterus: Secondary | ICD-10-CM | POA: Diagnosis not present

## 2020-10-17 DIAGNOSIS — K219 Gastro-esophageal reflux disease without esophagitis: Secondary | ICD-10-CM | POA: Diagnosis not present

## 2020-10-18 DIAGNOSIS — E119 Type 2 diabetes mellitus without complications: Secondary | ICD-10-CM | POA: Diagnosis not present

## 2020-10-18 DIAGNOSIS — G43909 Migraine, unspecified, not intractable, without status migrainosus: Secondary | ICD-10-CM | POA: Diagnosis not present

## 2020-10-18 DIAGNOSIS — D500 Iron deficiency anemia secondary to blood loss (chronic): Secondary | ICD-10-CM | POA: Diagnosis not present

## 2020-10-18 DIAGNOSIS — Z881 Allergy status to other antibiotic agents status: Secondary | ICD-10-CM | POA: Diagnosis not present

## 2020-10-18 DIAGNOSIS — Z86711 Personal history of pulmonary embolism: Secondary | ICD-10-CM | POA: Diagnosis not present

## 2020-10-18 DIAGNOSIS — Z6839 Body mass index (BMI) 39.0-39.9, adult: Secondary | ICD-10-CM | POA: Diagnosis not present

## 2020-10-18 DIAGNOSIS — G4733 Obstructive sleep apnea (adult) (pediatric): Secondary | ICD-10-CM | POA: Diagnosis not present

## 2020-10-18 DIAGNOSIS — D251 Intramural leiomyoma of uterus: Secondary | ICD-10-CM | POA: Diagnosis not present

## 2020-10-18 DIAGNOSIS — K219 Gastro-esophageal reflux disease without esophagitis: Secondary | ICD-10-CM | POA: Diagnosis not present

## 2020-10-18 DIAGNOSIS — I10 Essential (primary) hypertension: Secondary | ICD-10-CM | POA: Diagnosis not present

## 2020-10-18 DIAGNOSIS — N939 Abnormal uterine and vaginal bleeding, unspecified: Secondary | ICD-10-CM | POA: Diagnosis not present

## 2020-10-18 DIAGNOSIS — R000 Tachycardia, unspecified: Secondary | ICD-10-CM | POA: Diagnosis not present

## 2020-10-18 DIAGNOSIS — D250 Submucous leiomyoma of uterus: Secondary | ICD-10-CM | POA: Diagnosis not present

## 2020-10-18 DIAGNOSIS — E7800 Pure hypercholesterolemia, unspecified: Secondary | ICD-10-CM | POA: Diagnosis not present

## 2020-10-18 DIAGNOSIS — E669 Obesity, unspecified: Secondary | ICD-10-CM | POA: Diagnosis not present

## 2020-10-26 DIAGNOSIS — I10 Essential (primary) hypertension: Secondary | ICD-10-CM | POA: Diagnosis not present

## 2020-10-26 DIAGNOSIS — R002 Palpitations: Secondary | ICD-10-CM | POA: Diagnosis not present

## 2020-10-26 DIAGNOSIS — R000 Tachycardia, unspecified: Secondary | ICD-10-CM | POA: Diagnosis not present

## 2020-11-08 ENCOUNTER — Telehealth: Payer: Self-pay | Admitting: Neurology

## 2020-11-08 NOTE — Telephone Encounter (Signed)
Received request for PA for [employer].  Patient is no longer under our care.  PA not done.

## 2020-11-14 DIAGNOSIS — E1165 Type 2 diabetes mellitus with hyperglycemia: Secondary | ICD-10-CM | POA: Diagnosis not present

## 2020-11-14 DIAGNOSIS — I10 Essential (primary) hypertension: Secondary | ICD-10-CM | POA: Diagnosis not present

## 2022-04-29 ENCOUNTER — Ambulatory Visit (INDEPENDENT_AMBULATORY_CARE_PROVIDER_SITE_OTHER): Payer: BC Managed Care – PPO | Admitting: Neurology

## 2022-04-29 ENCOUNTER — Encounter: Payer: Self-pay | Admitting: Neurology

## 2022-04-29 VITALS — BP 160/91 | HR 78 | Ht 67.0 in | Wt 256.0 lb

## 2022-04-29 DIAGNOSIS — Z79899 Other long term (current) drug therapy: Secondary | ICD-10-CM

## 2022-04-29 DIAGNOSIS — R269 Unspecified abnormalities of gait and mobility: Secondary | ICD-10-CM | POA: Diagnosis not present

## 2022-04-29 DIAGNOSIS — M5104 Intervertebral disc disorders with myelopathy, thoracic region: Secondary | ICD-10-CM

## 2022-04-29 DIAGNOSIS — G35 Multiple sclerosis: Secondary | ICD-10-CM | POA: Diagnosis not present

## 2022-04-29 NOTE — Progress Notes (Signed)
[facility]  PATIENT: Bridget Lawrence DOB: 03/05/1971  REFERRING DOCTOR OR PCP:   Gayla Medicus, MD SOURCE: Patient, notes from Dr. Ellin Goodie, imaging and lab reports, multiple MRI scans personally reviewed.  _________________________________   HISTORICAL  CHIEF COMPLAINT:  Chief Complaint  Patient presents with   New Patient (Initial Visit)    RM 2, alone. Paper referral from Gayla Medicus, MD for MS/second opinion. Ambulates with canes.    HISTORY OF PRESENT ILLNESS:  I had the pleasure of seeing your patient, [name], at the [place] [facility] for neurologic second opinion regarding the possibility of multiple sclerosis.  She is a 51 year old woman who had an inflamed sensation in the entire left side (leg greater than face and all) that was present when she woke up 1 day in 2020.    She was fine the next day but symptoms recurred a couple more times and she sought medical attention.   She also noted dizziness and nausea when she turned her head to the left (starting in 2020 and lasting until 2022).   She also had issues with her balance.    She had a thoracic MRI in December 2020 showing spinal cord compression at T7-T8 and T8-T9 (I do not have those images but the radiology report mentions myelopathic signal within the spinal cord at those levels).    She had surgery at [facility] February 2021 (T6-T9 laminectomy - Dr. Lurene Shadow).    She felt she improved after the surgery.   Gait improved.   She was also having anemia and other laboratory abnormalities and found to have multiple fibroids.   She had a hysterectomy late 2021.    She had more neurologic symptoms and gait issues and repeat MRI T-spine 06/08/2021 showed continued severe stenosis and she had a second spinal operaion with T3-T11 decompression and fusion.  She has had difficulty recovering from that surgery.  SInce the surgery, she has has had difficulty walking but has improved from wheelchair  to walker to crutches.   Due to her dizziness, she had MRA of the neck 10/09/2021 that was normal and a repeat brain MRI 01/14/2022 showing progression including an enhancing lesion worrisome for demyelination..  Dynamic angiography had not shown abnormality to explain her dizziness.  Those symptoms have actually improved some.  She was able to log into MyChart to show me the most recent brain MRI scan.  I compared it with the MRI from 05/31/2019.  During the interim she developed several new lesions in the hemispheres and 1 more in the pons.  There was a classic periventricular focus.  Additionally, 1 new lesion in the posterior left frontal lobe showed closed ring enhancement.  Currently, her greatest impairment is poor gait ad left leg weakness and spasticity.    She also has bilateral leg numbness.   She needs to use crutches and cannot safely take steps without them.     She went from wheelchair to walker to crutches since the thoracic spine surgery (using crutches mostly since March 2023) .     Her arms are strong and she occasionally notes tingling in her hands.   She had hesitancy after surgery but bladder function is usually fine now.   She deies issues with vision and sees ophthalmology every 6 months as she has Type 2 NIDDM.     She notes fatigue frequently.   She sleeps well.     She is occasioanlly moody but denies  actual depression.    Cognitively she sometimes has word finding difficulty and making some errors with processing (she stopped day-trading due to this).    Vascular risks:   Type 2 NIDDM, HTN.   No smoking.    She has FH of MS (brother ad his daughter).       Imaging: MRI of the head 05/31/2019 shows foci in the right lower pons, and 3 or 4 T2/FLAIR hyperintense foci in the hemispheres, one oval-shaped in the deep white matter of the left posterior frontal lobe and 1 in the juxtacortical right frontal lobe.  The overall pattern is nonspecific but consistent with MS.  MRI of the  cervical spine 09/05/2019 showed a normal spinal cord and a large herniated disc at C7-T1 causing mild spinal stenosis but not spinal cord compression.  There was no nerve root compression at that level.  There was mild DJD elsewhere in the cervical spine did not cause any spinal stenosis or nerve root compression. Try to stop by MRI report of the thoracic spine from 06/08/2021 showed severe spinal stenosis at T8-T9 despite posterior laminectomy and moderately severe spinal stenosis at T9-T10 without spinal cord compression.  MRI brain 01/14/2022 showed interval increase in foci including a 6 mm enhancing focus left hemisphere and tiny new non-enhancing pontine and a couple hemispheric foci not present in 2020  REVIEW OF SYSTEMS: Constitutional: No fevers, chills, sweats, or change in appetite Eyes: No visual changes, double vision, eye pain Ear, nose and throat: No hearing loss, ear pain, nasal congestion, sore throat Cardiovascular: No chest pain, palpitations Respiratory:  No shortness of breath at rest or with exertion.   No wheezes GastrointestinaI: No nausea, vomiting, diarrhea, abdominal pain, fecal incontinence Genitourinary:  No dysuria, urinary retention or frequency.  No nocturia. Musculoskeletal:  No neck pain, back pain Integumentary: No rash, pruritus, skin lesions Neurological: as above Psychiatric: No depression at this time.  No anxiety Endocrine: No palpitations, diaphoresis, change in appetite, change in weigh or increased thirst Hematologic/Lymphatic:  No anemia, purpura, petechiae. Allergic/Immunologic: No itchy/runny eyes, nasal congestion, recent allergic reactions, rashes  ALLERGIES: Allergies  Allergen Reactions   Doxycycline Other (See Comments)    Pt states," I don't remember.'     Gabapentin Other (See Comments)    Slurred speech.      Topiramate Other (See Comments)    Tingling of fingers and toes /altered taste and smell      HOME  MEDICATIONS:  Current Outpatient Medications:    amLODipine (NORVASC) 5 MG tablet, Take 5 mg by mouth daily., Disp: , Rfl:    atenolol (TENORMIN) 50 MG tablet, Take 50 mg by mouth daily., Disp: , Rfl:    Cholecalciferol (VITAMIN D) 2000 units CAPS, Take by mouth., Disp: , Rfl:    ipratropium-albuterol (DUONEB) 0.5-2.5 (3) MG/3ML SOLN, Inhale 3 mLs into the lungs every 4 (four) hours as needed., Disp: 360 mL, Rfl: 3   metFORMIN (GLUCOPHAGE) 1000 MG tablet, Take 1,000 mg by mouth 2 (two) times daily with a meal., Disp: , Rfl:    pantoprazole (PROTONIX) 40 MG tablet, Take 40 mg by mouth daily., Disp: , Rfl:    Probiotic Product (PROBIOTIC-10) CAPS, Take by mouth., Disp: , Rfl:    Semaglutide, 2 MG/DOSE, (OZEMPIC, 2 MG/DOSE,) 8 MG/3ML SOPN, Inject 1 Dose into the skin once a week., Disp: , Rfl:   PAST MEDICAL HISTORY: Past Medical History:  Diagnosis Date   Diabetes ([place])    High blood pressure  Migraine variant with headache 06/15/2019    PAST SURGICAL HISTORY: Past Surgical History:  Procedure Laterality Date   ANKLE SURGERY Right 11/19/2015   HERNIA REPAIR  2019    FAMILY HISTORY: Family History  Problem Relation Age of Onset   Heart attack Mother    Diabetes Mother    High blood pressure Mother    Stroke Mother    Heart attack Sister    Diabetes Sister    High blood pressure Sister    Diabetes Brother    High blood pressure Brother    Stroke Maternal Grandmother    Cancer Father     SOCIAL HISTORY:  Social History   Socioeconomic History   Marital status: Married    Spouse name: Aerion Casciano   Number of children: 0   Years of education: Not on file   Highest education level: Not on file  Occupational History   Not on file  Tobacco Use   Smoking status: Never   Smokeless tobacco: Never  Vaping Use   Vaping Use: Never used  Substance and Sexual Activity   Alcohol use: Yes    Comment: 4/wk   Drug use: Never   Sexual activity: Yes    Partners: Female     Birth control/protection: None  Other Topics Concern   Not on file  Social History Narrative   Left handed   Caffeine~ less than half a cup   Lives at home with spouse susan    Social Determinants of Health   Financial Resource Strain: Not on file  Food Insecurity: Not on file  Transportation Needs: Not on file  Physical Activity: Not on file  Stress: Not on file  Social Connections: Not on file  Intimate Partner Violence: Not on file     PHYSICAL EXAM  Vitals:   04/29/22 0855  BP: (!) 160/91  Pulse: 78  Weight: 256 lb (116.1 kg)  Height: 5\' 7"  (1.702 m)    Body mass index is 40.1 kg/m.   General: The patient is well-developed and well-nourished and in no acute distress  HEENT:  Head is [place]/AT.  Sclera are anicteric.  Funduscopic exam shows normal optic discs and retinal vessels.  Neck: No carotid bruits are noted.  The neck is nontender.  Cardiovascular: The heart has a regular rate and rhythm with a normal S1 and S2. There were no murmurs, gallops or rubs.    Skin: Extremities are without rash or  edema.  Musculoskeletal:  Back is nontender  Neurologic Exam  Mental status: The patient is alert and oriented x 3 at the time of the examination. The patient has apparent normal recent and remote memory, with an apparently normal attention span and concentration ability.   Speech is normal.  Cranial nerves: Extraocular movements are full. Pupils are equal, round, and reactive to light and accomodation.  Visual fields are full.  Facial symmetry is present. There is good facial sensation to soft touch bilaterally.Facial strength is normal.  Trapezius and sternocleidomastoid strength is normal. No dysarthria is noted.  The tongue is midline, and the patient has symmetric elevation of the soft palate. No obvious hearing deficits are noted.  Motor:  Muscle bulk is normal.   Tone is increased in the left leg.. Strength is  5 / 5 in both arms but reduced in the legs, 4-/5 left  iliopsoas, 4/5 left foot extension, and 4+/5 elsewhere in the left leg and 4+/5 in the right leg  Sensory: Sensory testing is  intact to pinprick, soft touch and vibration sensation in the arms.  She has reduced vibration sensation in the left leg..  Coordination: Cerebellar testing reveals good finger-nose-finger and heel-to-shin bilaterally.  Gait and station: Station is normal.   Gait requires support.  She has a left foot drop.  She cannot tandem walk.. Romberg is negative.   Reflexes: Deep tendon reflexes are symmetric and normal in the arms but increased in the legs, left greater than right.  There are crossed adductor responses..   Plantar responses are right and extensor left.    DIAGNOSTIC DATA (LABS, IMAGING, TESTING) - I reviewed patient records, labs, notes, testing and imaging myself where available.  Lab Results  Component Value Date   WBC 5.5 08/31/2019   HGB 11.3 (L) 08/31/2019   HCT 34.6 (L) 08/31/2019   MCV 86.9 08/31/2019   PLT 354 08/31/2019      Component Value Date/Time   NA 134 (L) 08/31/2019 1448   NA 136 07/03/2018 1007   K 4.2 08/31/2019 1448   CL 101 08/31/2019 1448   CO2 27 08/31/2019 1448   GLUCOSE 118 (H) 08/31/2019 1448   BUN 15 08/31/2019 1448   BUN 15 07/03/2018 1007   CREATININE 0.65 08/31/2019 1448   CALCIUM 9.7 08/31/2019 1448   PROT 7.6 08/31/2019 1448   PROT 7.6 07/03/2018 1007   ALBUMIN 4.3 07/03/2018 1007   AST 31 08/31/2019 1448   ALT 56 (H) 08/31/2019 1448   ALKPHOS 59 07/03/2018 1007   BILITOT 0.4 08/31/2019 1448   BILITOT 0.3 07/03/2018 1007   GFRNONAA 104 08/31/2019 1448   GFRAA 120 08/31/2019 1448   Lab Results  Component Value Date   CHOL 174 05/25/2019   HDL 45 (L) 05/25/2019   LDLCALC 103 (H) 05/25/2019   TRIG 165 (H) 05/25/2019   CHOLHDL 3.9 05/25/2019   Lab Results  Component Value Date   HGBA1C 8.3 (H) 08/31/2019   Lab Results  Component Value Date   [id number] 08/11/2019   Lab Results  Component  Value Date   TSH 1.45 05/25/2019       ASSESSMENT AND PLAN  Multiple sclerosis ([place]) - Plan: Hepatitis B surface antigen, HIV Antibody (routine testing w rflx), Varicella zoster antibody, IgG, QuantiFERON-TB Gold Plus, Hepatitis B core antibody, total, Hepatitis B surface antibody,qualitative, Hepatitis C antibody, CBC with Differential/Platelet, Comprehensive metabolic panel  High risk medication use - Plan: Hepatitis B surface antigen, HIV Antibody (routine testing w rflx), Varicella zoster antibody, IgG, QuantiFERON-TB Gold Plus, Hepatitis B core antibody, total, Hepatitis B surface antibody,qualitative, Hepatitis C antibody, CBC with Differential/Platelet, Comprehensive metabolic panel  Thoracic disc disorder with myelopathy  Gait disturbance   In summary, Ms. 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  Unfortunately, after her second operation she developed weakness in her legs and still requires the use of crutches.  MRI of the brain in 2020 had shown some T2/FLAIR hyperintense foci in the hemispheres.  Although the foci were consistent with MS, they were fairly nonspecific.  However, since that time she developed additional foci in the brain including an enhancing lesion on her most recent brain MRI.  I reviewed the actual images.  I discussed with her that I feel that her problems with her gait are likely mostly due to her thoracic myelopathy.  It is possible that the small foci in the pons could be contributing a little bit but they would be unlikely to cause much  difficulty.  The new findings on the brain MRI allow her to meet the McDonald criteria for MS.  Therefore, I would like to get her started on a disease modifying therapy.  We discussed some options.  She is not presenting with an aggressive MS and is a candidate for most of the different medications.  She is most interested in one of the oral agents and we discussed Aubagio and Vumerity (or  Tecfidera) as possibilities.  Additionally, Mavenclad is another option that may allow her to complete lifelong treatment in the first 2 years.  We will check some blood work today to determine if she is a candidate for these treatments.  I will call her next week when we get the results so that we can decide on a therapy.  If she does decide on Mavenclad I would like to continue to follow her for the next 2 years to ensure that she is compliant with necessary lab work.  If she chooses one of the other options and you or her would prefer, I could also continue to see her.  She is continuing to work with therapy to try to improve her gait further.  I we will see her back in about 4 months based on her treatment therapy.  We discussed that if she chooses Aubagio she will need to have liver laboratory tests for 6 months and and this could be done closer to her home.  Vumerity would require CBC with differential every 6 months and Mavenclad would require several visits for blood work in the next 2 years  Thank you for asking me to see Ms. Mel Almond.  Please let me know if I can be of further assistance with her or other patients in the future    Seanna Sisler A. Felecia Shelling, MD, [facility] [id number], [id number] PM Certified in Neurology, Clinical Neurophysiology, Sleep Medicine and Neuroimaging  [facility] [address] [place], [place] [address] [phone]

## 2022-05-02 LAB — COMPREHENSIVE METABOLIC PANEL
ALT: 66 IU/L — ABNORMAL HIGH (ref 0–32)
AST: 44 IU/L — ABNORMAL HIGH (ref 0–40)
Albumin/Globulin Ratio: 1.3 (ref 1.2–2.2)
Albumin: 4.4 g/dL (ref 3.8–4.9)
Alkaline Phosphatase: 104 IU/L (ref 44–121)
BUN/Creatinine Ratio: 24 — ABNORMAL HIGH (ref 9–23)
BUN: 15 mg/dL (ref 6–24)
Bilirubin Total: 0.3 mg/dL (ref 0.0–1.2)
CO2: 22 mmol/L (ref 20–29)
Calcium: 9.5 mg/dL (ref 8.7–10.2)
Chloride: 99 mmol/L (ref 96–106)
Creatinine, Ser: 0.62 mg/dL (ref 0.57–1.00)
Globulin, Total: 3.3 g/dL (ref 1.5–4.5)
Glucose: 216 mg/dL — ABNORMAL HIGH (ref 70–99)
Potassium: 4.3 mmol/L (ref 3.5–5.2)
Sodium: 137 mmol/L (ref 134–144)
Total Protein: 7.7 g/dL (ref 6.0–8.5)
eGFR: 106 mL/min/{1.73_m2} (ref >59)

## 2022-05-02 LAB — CBC WITH DIFFERENTIAL/PLATELET
Basophils Absolute: 0 10*3/uL (ref 0.0–0.2)
Basos: 1 %
EOS (ABSOLUTE): 0.1 10*3/uL (ref 0.0–0.4)
Eos: 2 %
Hematocrit: 38 % (ref 34.0–46.6)
Hemoglobin: 12.3 g/dL (ref 11.1–15.9)
Immature Grans (Abs): 0 10*3/uL (ref 0.0–0.1)
Immature Granulocytes: 0 %
Lymphocytes Absolute: 2.4 10*3/uL (ref 0.7–3.1)
Lymphs: 41 %
MCH: 27.4 pg (ref 26.6–33.0)
MCHC: 32.4 g/dL (ref 31.5–35.7)
MCV: 85 fL (ref 79–97)
Monocytes Absolute: 0.5 10*3/uL (ref 0.1–0.9)
Monocytes: 8 %
Neutrophils Absolute: 2.7 10*3/uL (ref 1.4–7.0)
Neutrophils: 48 %
Platelets: 317 10*3/uL (ref 150–450)
RBC: 4.49 x10E6/uL (ref 3.77–5.28)
RDW: 12.6 % (ref 11.7–15.4)
WBC: 5.8 10*3/uL (ref 3.4–10.8)

## 2022-05-02 LAB — HEPATITIS B SURFACE ANTIGEN: Hepatitis B Surface Ag: NEGATIVE

## 2022-05-02 LAB — HEPATITIS C ANTIBODY: Hep C Virus Ab: NONREACTIVE

## 2022-05-02 LAB — QUANTIFERON-TB GOLD PLUS
QuantiFERON Mitogen Value: >10 IU/mL
QuantiFERON Nil Value: 0.02 IU/mL
QuantiFERON TB1 Ag Value: 0.03 IU/mL
QuantiFERON TB2 Ag Value: 0.03 IU/mL
QuantiFERON-TB Gold Plus: NEGATIVE

## 2022-05-02 LAB — VARICELLA ZOSTER ANTIBODY, IGG: Varicella zoster IgG: 726 index (ref >165)

## 2022-05-02 LAB — HIV ANTIBODY (ROUTINE TESTING W REFLEX): HIV Screen 4th Generation wRfx: NONREACTIVE

## 2022-05-02 LAB — HEPATITIS B SURFACE ANTIBODY,QUALITATIVE: Hep B Surface Ab, Qual: NONREACTIVE

## 2022-05-02 LAB — HEPATITIS B CORE ANTIBODY, TOTAL: Hep B Core Total Ab: NEGATIVE

## 2022-05-06 ENCOUNTER — Telehealth: Payer: Self-pay

## 2022-05-06 NOTE — Telephone Encounter (Signed)
-----   Message from Asa Lente, MD sent at 05/06/2022  4:29 PM EDT ----- We had talked about a couple MS DMT options at the last visit.  Labs are back.  Her liver function tests were mildly elevated so I would recommend that she choose between Vumerity and Mavenclad.

## 2022-05-06 NOTE — Telephone Encounter (Signed)
I called patient to discuss. No answer, left a message asking her to call us back. 

## 2022-05-07 NOTE — Telephone Encounter (Signed)
Took call from phone staff and spoke with pt. Relayed results per Dr. Epimenio Foot. She verbalized understanding. She had not signed any start forms yet. She would like both options emailed to her at [email or web address]. She is going to talk with friend who is pharmacist about options and call back Thursday on which option she would like to go with. She will call back if she does not get email.  I sent email with Vumerity/Mavenclad start forms.  KD,RN aware of plan.

## 2022-05-12 ENCOUNTER — Encounter: Payer: Self-pay | Admitting: Neurology

## 2022-05-13 ENCOUNTER — Telehealth: Payer: Self-pay | Admitting: Neurology

## 2022-05-13 NOTE — Telephone Encounter (Signed)
Patient has an appointment on 7/3/223 to discuss DMT options further with Dr. Epimenio Foot.

## 2022-05-20 ENCOUNTER — Encounter: Payer: Self-pay | Admitting: Neurology

## 2022-05-20 ENCOUNTER — Telehealth (INDEPENDENT_AMBULATORY_CARE_PROVIDER_SITE_OTHER): Payer: BC Managed Care – PPO | Admitting: Neurology

## 2022-05-20 DIAGNOSIS — R269 Unspecified abnormalities of gait and mobility: Secondary | ICD-10-CM

## 2022-05-20 DIAGNOSIS — G35 Multiple sclerosis: Secondary | ICD-10-CM

## 2022-05-20 DIAGNOSIS — R42 Dizziness and giddiness: Secondary | ICD-10-CM

## 2022-05-20 DIAGNOSIS — G35D Multiple sclerosis, unspecified: Secondary | ICD-10-CM

## 2022-05-20 DIAGNOSIS — M5104 Intervertebral disc disorders with myelopathy, thoracic region: Secondary | ICD-10-CM

## 2022-05-20 DIAGNOSIS — Z79899 Other long term (current) drug therapy: Secondary | ICD-10-CM
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
## 2022-05-24 ENCOUNTER — Encounter: Payer: Self-pay | Admitting: Neurology

## 2022-05-28 ENCOUNTER — Telehealth: Payer: Self-pay

## 2022-05-28 NOTE — Telephone Encounter (Signed)
Faxed Vumerity start form to [employer]. Received a receipt of confirmation.  Unclear who patient's pharmacy insurance provider is- waiting on patient to respond via mychart.

## 2022-05-28 NOTE — Telephone Encounter (Deleted)
Faxed Vumerity start form to [employer]. Received a receipt of confirmation.

## 2022-05-28 NOTE — Telephone Encounter (Signed)
I called [facility]. No PA is needed for Vumerity.

## 2022-06-04 ENCOUNTER — Telehealth: Payer: Self-pay | Admitting: Neurology

## 2022-06-04 NOTE — Telephone Encounter (Signed)
PA for Vumerity was completed via fax from [facility]. See other phone note.

## 2022-06-04 NOTE — Telephone Encounter (Signed)
[employer]/ [employer] is calling about a prior authorization for Vumerity.

## 2022-06-04 NOTE — Telephone Encounter (Signed)
Received PA request for Vumerity from [facility] via fax. Completed and faxed back. Should have a determination within 3-5 business days.

## 2022-06-06 NOTE — Telephone Encounter (Signed)
Received fax from [facility] that PA approved 06/05/22-06/06/23. PA# [employer] [id number]

## 2022-06-15 ENCOUNTER — Telehealth: Payer: Self-pay | Admitting: Neurology

## 2022-06-17 NOTE — Telephone Encounter (Signed)
Note from orthopedics in [place] and MRIs were reviewed.  We had discussed the MRIs at her last televisit.  She has disc herniation at T7 T1 causing mild spinal stenosis.  MRI of the thoracic spine showed multilevel thoracic fusion and MRI of the lumbar spine shows spinal stenosis mostly due to epidural fibromatosis.  She is seeing orthopedics about the spinal issues.  Injections were being considered.  As far as her MS, the Vumerity start form has been trending."  To see if she has been able to start and get voicemail

## 2022-07-02 ENCOUNTER — Encounter: Payer: Self-pay | Admitting: Neurology

## 2022-07-16 ENCOUNTER — Telehealth: Payer: Self-pay

## 2022-07-16 NOTE — Telephone Encounter (Signed)
PA for mavenclad was approved by [facility] from 07/16/22-08/30/22. PA #: [id number]. [employer] informed.

## 2022-07-16 NOTE — Telephone Encounter (Signed)
Mavenclad start form faxed to [employer]. Received a receipt of confirmation.  Mavenclad PA completed via CMM and sent to [facility]. Key: [id number]. Should have a determination within 1-3 business days.

## 2022-08-21 ENCOUNTER — Telehealth: Payer: Self-pay | Admitting: Neurology

## 2022-08-21 MED ORDER — PREDNISONE 50 MG PO TABS: 600.0000 mg | ORAL_TABLET | Freq: Every day | ORAL | 0 refills | Status: AC

## 2022-08-21 NOTE — Telephone Encounter (Signed)
Called the patient back and advised that Dr Felecia Shelling would recommend that the patient try oral high dose steroids and see if this helps. States uncertain if this is a MS exacerbation but would like to go ahead and send this in to see if this helps her feel better.

## 2022-08-21 NOTE — Telephone Encounter (Signed)
Called the pt and she states that on Sunday she woke up "feeling like crap"she states the whole left side of her body was hurting and she has this pins/needles sensation through the body. This and headaches are on the left side as well. She continues to have cramping/spasms that are full body.  Pt states that last week she was sick but that had cleared up and had not symptoms over the weekend. Denies signs of UTI. She thought Monday it was improving but worsened tues and today has not eased up.  States that she has never had this feeling and has not had to get steroids. Advised I would pass this information along to Dr Felecia Shelling and see what he recommends.

## 2022-08-21 NOTE — Telephone Encounter (Signed)
Pt is calling. Stated she sent a [employer]. Pt said she is having spasm and cramp and need to be seen. Pt is requesting a call back from nurse.

## 2022-09-02 ENCOUNTER — Telehealth: Payer: Self-pay | Admitting: Neurology

## 2022-09-02 NOTE — Telephone Encounter (Signed)
PA completed on [employer] KEY: [id number] Will await determination

## 2022-09-03 NOTE — Telephone Encounter (Signed)
Received fax from [employer] that [employer] approved 09/02/22-10/17/22.

## 2022-09-09 NOTE — Addendum Note (Signed)
Addended by: [place] Angelus A on: 09/09/2022 09:41 AM   Modules accepted: Orders

## 2022-10-01 ENCOUNTER — Ambulatory Visit: Payer: BC Managed Care – PPO | Admitting: Neurology

## 2022-10-29 ENCOUNTER — Encounter: Payer: Self-pay | Admitting: Neurology

## 2022-10-29 ENCOUNTER — Ambulatory Visit (INDEPENDENT_AMBULATORY_CARE_PROVIDER_SITE_OTHER): Payer: BC Managed Care – PPO | Admitting: Neurology

## 2022-10-29 VITALS — BP 151/93 | HR 87 | Ht 67.0 in | Wt 255.5 lb

## 2022-10-29 DIAGNOSIS — Z79899 Other long term (current) drug therapy: Secondary | ICD-10-CM

## 2022-10-29 DIAGNOSIS — E119 Type 2 diabetes mellitus without complications: Secondary | ICD-10-CM

## 2022-10-29 DIAGNOSIS — G35 Multiple sclerosis: Secondary | ICD-10-CM | POA: Diagnosis not present

## 2022-10-29 DIAGNOSIS — F988 Other specified behavioral and emotional disorders with onset usually occurring in childhood and adolescence: Secondary | ICD-10-CM

## 2022-10-29 DIAGNOSIS — R269 Unspecified abnormalities of gait and mobility: Secondary | ICD-10-CM
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
## 2022-10-30 LAB — CBC WITH DIFFERENTIAL/PLATELET
Basophils Absolute: 0 10*3/uL (ref 0.0–0.2)
Basos: 1 %
EOS (ABSOLUTE): 0.1 10*3/uL (ref 0.0–0.4)
Eos: 2 %
Hematocrit: 38.4 % (ref 34.0–46.6)
Hemoglobin: 12.5 g/dL (ref 11.1–15.9)
Immature Grans (Abs): 0 10*3/uL (ref 0.0–0.1)
Immature Granulocytes: 0 %
Lymphocytes Absolute: 1 10*3/uL (ref 0.7–3.1)
Lymphs: 21 %
MCH: 29.1 pg (ref 26.6–33.0)
MCHC: 32.6 g/dL (ref 31.5–35.7)
MCV: 89 fL (ref 79–97)
Monocytes Absolute: 0.6 10*3/uL (ref 0.1–0.9)
Monocytes: 12 %
Neutrophils Absolute: 3.2 10*3/uL (ref 1.4–7.0)
Neutrophils: 64 %
Platelets: 291 10*3/uL (ref 150–450)
RBC: 4.3 x10E6/uL (ref 3.77–5.28)
RDW: 13.2 % (ref 11.7–15.4)
WBC: 5.1 10*3/uL (ref 3.4–10.8)

## 2022-10-30 LAB — COMPREHENSIVE METABOLIC PANEL
ALT: 70 IU/L — ABNORMAL HIGH (ref 0–32)
AST: 54 IU/L — ABNORMAL HIGH (ref 0–40)
Albumin/Globulin Ratio: 1.6 (ref 1.2–2.2)
Albumin: 4.6 g/dL (ref 3.8–4.9)
Alkaline Phosphatase: 79 IU/L (ref 44–121)
BUN/Creatinine Ratio: 22 (ref 9–23)
BUN: 15 mg/dL (ref 6–24)
Bilirubin Total: 0.3 mg/dL (ref 0.0–1.2)
CO2: 22 mmol/L (ref 20–29)
Calcium: 9.5 mg/dL (ref 8.7–10.2)
Chloride: 99 mmol/L (ref 96–106)
Creatinine, Ser: 0.69 mg/dL (ref 0.57–1.00)
Globulin, Total: 2.8 g/dL (ref 1.5–4.5)
Glucose: 107 mg/dL — ABNORMAL HIGH (ref 70–99)
Potassium: 4.3 mmol/L (ref 3.5–5.2)
Sodium: 141 mmol/L (ref 134–144)
Total Protein: 7.4 g/dL (ref 6.0–8.5)
eGFR: 104 mL/min/{1.73_m2} (ref >59)

## 2022-10-30 LAB — LIPID PANEL
Chol/HDL Ratio: 3.4 ratio (ref 0.0–4.4)
Cholesterol, Total: 197 mg/dL (ref 100–199)
HDL: 58 mg/dL (ref >39)
LDL Chol Calc (NIH): 119 mg/dL — ABNORMAL HIGH (ref 0–99)
Triglycerides: 115 mg/dL (ref 0–149)
VLDL Cholesterol Cal: 20 mg/dL (ref 5–40)

## 2022-11-04 ENCOUNTER — Ambulatory Visit: Payer: BC Managed Care – PPO | Admitting: Neurology

## 2022-11-15 ENCOUNTER — Encounter: Payer: Self-pay | Admitting: Neurology

## 2022-11-25 ENCOUNTER — Other Ambulatory Visit: Payer: Self-pay | Admitting: Neurology

## 2022-11-25 MED ORDER — AMPHETAMINE-DEXTROAMPHET ER 15 MG PO CP24: 15.0000 mg | ORAL_CAPSULE | ORAL | 0 refills | Status: DC

## 2022-12-03 ENCOUNTER — Encounter: Payer: Self-pay | Admitting: Neurology

## 2022-12-04 ENCOUNTER — Other Ambulatory Visit: Payer: Self-pay | Admitting: Neurology

## 2022-12-04 MED ORDER — BACLOFEN 10 MG PO TABS: ORAL_TABLET | ORAL | 5 refills | Status: DC

## 2022-12-20 ENCOUNTER — Encounter: Payer: Self-pay | Admitting: Neurology

## 2022-12-23 ENCOUNTER — Other Ambulatory Visit: Payer: Self-pay | Admitting: *Deleted

## 2022-12-23 MED ORDER — AMPHETAMINE-DEXTROAMPHET ER 15 MG PO CP24: 15.0000 mg | ORAL_CAPSULE | ORAL | 0 refills | Status: DC

## 2022-12-23 MED ORDER — CYCLOBENZAPRINE HCL 5 MG PO TABS: 5.0000 mg | ORAL_TABLET | Freq: Every day | ORAL | 11 refills | Status: DC

## 2022-12-23 NOTE — Telephone Encounter (Signed)
Called pt, relayed Dr. Garth Bigness message. Pt verbalized understanding. Aware rx flexeril called into pharmacy for her.   Scheduled mychart VV for 12/25/22 at 10am with Dr. Felecia Shelling. She plans to apply via state for disability.

## 2022-12-23 NOTE — Telephone Encounter (Signed)
Per drug registry, last refilled 11/25/22 #30. Last seen 10/29/22 and next f/u 03/04/23.

## 2022-12-23 NOTE — Addendum Note (Signed)
Addended by: Wyvonnia Lora on: 12/23/2022 05:29 PM   Modules accepted: Orders

## 2022-12-25 ENCOUNTER — Telehealth (INDEPENDENT_AMBULATORY_CARE_PROVIDER_SITE_OTHER): Payer: Self-pay | Admitting: Neurology

## 2022-12-25 ENCOUNTER — Encounter: Payer: Self-pay | Admitting: Neurology

## 2022-12-25 DIAGNOSIS — R42 Dizziness and giddiness: Secondary | ICD-10-CM

## 2022-12-25 DIAGNOSIS — G35 Multiple sclerosis: Secondary | ICD-10-CM

## 2022-12-25 DIAGNOSIS — R269 Unspecified abnormalities of gait and mobility: Secondary | ICD-10-CM

## 2022-12-25 DIAGNOSIS — F988 Other specified behavioral and emotional disorders with onset usually occurring in childhood and adolescence: Secondary | ICD-10-CM | POA: Insufficient documentation

## 2022-12-25 DIAGNOSIS — M5104 Intervertebral disc disorders with myelopathy, thoracic region: Secondary | ICD-10-CM

## 2022-12-25 DIAGNOSIS — R5383 Other fatigue: Secondary | ICD-10-CM | POA: Insufficient documentation

## 2022-12-25 NOTE — Progress Notes (Signed)
[facility]  PATIENT: Bridget Lawrence DOB: 01/01/1971  REFERRING DOCTOR OR PCP:   Edythe Lynn, MD SOURCE: Patient, notes from Dr. Larose Kells, imaging and lab reports, multiple MRI scans personally reviewed.  _________________________________   HISTORICAL  CHIEF COMPLAINT:  Chief Complaint  Patient presents with   Multiple Sclerosis   Other    Myelopathy    HISTORY OF PRESENT ILLNESS:  Bridget Lawrence, at the [facility] at [facility] for neurologic second opinion regarding the possibility of multiple sclerosis.  Virtual Visit via Video Note I connected with Bridget Lawrence  on 12/25/22 at 10:00 AM EST by a video enabled telemedicine application and verified that I am speaking with the correct person.  I discussed the limitations of evaluation and management by telemedicine and the availability of in person appointments. The patient expressed understanding and agreed to proceed.  Patient at her home Provider in office  Update 12/24/2022: She has not been back to work since the spine surgery.  She was working as a Firefighter.     Due to her MS and spinal cord damage, she has had worsening physical functioning.  She has left greater than right leg weakness, foot drops and requires support to walk, even short distances.  Although she is able to use crutches, she cannot go a long distance.  Additionally, she has severe urinary dysfunction that has led to multiple urinary tract infections.  Tamsulosin initially helped her urinary retention but she is now needing to self-catheterize.  She has had multiple UTIs.  She has bilateral leg numbness which contributes to her balance issues.  She also has vertigo that is likely related to the MS.  She does have a focus in the pons that could affect vestibular function.  She also has mild cognitive impairment and cognitive and physical fatigue related to the MS.  Adderall helps some with focus and fatigue but she is  still not to baseline  She has pain from her head to toes on the left.   Whenever she gets an infection, she is much worse physically, pain-wise and cognitively for a week or more.    She has had multiple infections leading to worse functioning.    She did her first Mavenclad treatments October and November and tolerated it well.  Lymphocyte count dropped to 1.0 but not into the lower levels.  She will have a second year Mavenclad later this year.  She sleeps well.     She is occasioanlly moody but denies actual depression.    Cognitively she sometimes has word finding difficulty and making some errors with processing (she stopped day-trading due to this).  She had been doing day-trading and noted last year, that she made a couple mistakes that she would not have made a year earlier.  MS History: She is a 52 year old woman who had an inflamed sensation in the entire left side (leg greater than face and all) that was present when she woke up 1 day in 2020.    She was fine the next day but symptoms recurred a couple more times and she sought medical attention.   She also noted dizziness and nausea when she turned her head to the left (starting in 2020 and lasting until 2022).   She also had issues with her balance.    She had a thoracic MRI in December 2020 showing spinal cord compression at T7-T8 and T8-T9 (I do not have those images but the radiology report  mentions myelopathic signal within the spinal cord at those levels).    She had surgery at [facility] February 2021 (T6-T9 laminectomy - Dr. Alden Hipp).    She felt she improved after the surgery.   Gait improved.   She was also having anemia and other laboratory abnormalities and found to have multiple fibroids.   She had a hysterectomy late 2021.    She had more neurologic symptoms and gait issues and repeat MRI T-spine 06/08/2021 showed continued severe stenosis and she had a second spinal operaion with T3-T11 decompression and fusion.  She has had difficulty  recovering from that surgery.  SInce the surgery, she has has had difficulty walking but has improved from wheelchair to walker to crutches.   Due to her dizziness, she had MRA of the neck 10/09/2021 that was normal and a repeat brain MRI 01/14/2022 showing progression including an enhancing lesion worrisome for demyelination..  Dynamic angiography had not shown abnormality to explain her dizziness.  Those symptoms have actually improved some.  She was able to log into MyChart to show me the most recent brain MRI scan.  I compared it with the MRI from 05/31/2019.  During the interim she developed several new lesions in the hemispheres and 1 more in the pons.  There was a classic periventricular focus.  Additionally, 1 new lesion in the posterior left frontal lobe showed closed ring enhancement.   Vascular risks:   Type 2 NIDDM, HTN.   No smoking.    She has FH of MS (brother and his daughter).     She had a suspected PE in 2021 and CTA showed lymphadenopathy and one was metabolically active on PET - this is monitored with serial CT  Imaging: MRI of the head 05/31/2019 shows foci in the right lower pons, and 3 or 4 T2/FLAIR hyperintense foci in the hemispheres, one oval-shaped in the deep white matter of the left posterior frontal lobe and 1 in the juxtacortical right frontal lobe.  The overall pattern is nonspecific but consistent with MS.  MRI of the cervical spine 09/05/2019 showed a normal spinal cord and a large herniated disc at C7-T1 causing mild spinal stenosis but not spinal cord compression.  There was no nerve root compression at that level.  There was mild DJD elsewhere in the cervical spine did not cause any spinal stenosis or nerve root compression. Try to stop by MRI report of the thoracic spine from 06/08/2021 showed severe spinal stenosis at T8-T9 despite posterior laminectomy and moderately severe spinal stenosis at T9-T10 without spinal cord compression.  MRI brain 01/14/2022 showed  interval increase in foci including a 6 mm enhancing focus left hemisphere and tiny new non-enhancing pontine and a couple hemispheric foci not present in 2020  REVIEW OF SYSTEMS: Constitutional: No fevers, chills, sweats, or change in appetite Eyes: No visual changes, double vision, eye pain Ear, nose and throat: No hearing loss, ear pain, nasal congestion, sore throat Cardiovascular: No chest pain, palpitations Respiratory:  No shortness of breath at rest or with exertion.   No wheezes GastrointestinaI: No nausea, vomiting, diarrhea, abdominal pain, fecal incontinence Genitourinary:  No dysuria, urinary retention or frequency.  No nocturia. Musculoskeletal:  No neck pain, back pain Integumentary: No rash, pruritus, skin lesions Neurological: as above Psychiatric: No depression at this time.  No anxiety Endocrine: No palpitations, diaphoresis, change in appetite, change in weigh or increased thirst Hematologic/Lymphatic:  No anemia, purpura, petechiae. Allergic/Immunologic: No itchy/runny eyes, nasal congestion, recent allergic reactions, rashes  ALLERGIES: Allergies  Allergen Reactions   Doxycycline Other (See Comments)    Pt states," I don't remember.'     Gabapentin Other (See Comments)    Slurred speech.      Topiramate Other (See Comments)    Tingling of fingers and toes /altered taste and smell      HOME MEDICATIONS:  Current Outpatient Medications:    amLODipine (NORVASC) 5 MG tablet, Take 5 mg by mouth daily., Disp: , Rfl:    amphetamine-dextroamphetamine (ADDERALL XR) 15 MG 24 hr capsule, Take 1 capsule by mouth every morning., Disp: 30 capsule, Rfl: 0   atenolol (TENORMIN) 50 MG tablet, Take 50 mg by mouth daily., Disp: , Rfl:    Cholecalciferol (VITAMIN D) 2000 units CAPS, Take by mouth., Disp: , Rfl:    Cladribine, 10 Tabs, (MAVENCLAD, 10 TABS,) 10 MG TBPK, Take 20 mg by mouth daily. Take 20mg  PO on days 1-5 on month 1. Take 20mg  PO on days 1-5 on month 2.  Patien'ts first mavenclad dose Y1M1 was 08/28/22., Disp: , Rfl:    cyclobenzaprine (FLEXERIL) 5 MG tablet, Take 1-2 tablets (5-10 mg total) by mouth at bedtime., Disp: 60 tablet, Rfl: 11   ipratropium-albuterol (DUONEB) 0.5-2.5 (3) MG/3ML SOLN, Inhale 3 mLs into the lungs every 4 (four) hours as needed., Disp: 360 mL, Rfl: 3   metFORMIN (GLUCOPHAGE) 1000 MG tablet, Take 1,000 mg by mouth 2 (two) times daily with a meal., Disp: , Rfl:    pantoprazole (PROTONIX) 40 MG tablet, Take 40 mg by mouth daily., Disp: , Rfl:    Probiotic Product (PROBIOTIC-10) CAPS, Take by mouth., Disp: , Rfl:    Semaglutide, 2 MG/DOSE, (OZEMPIC, 2 MG/DOSE,) 8 MG/3ML SOPN, Inject 1 Dose into the skin once a week., Disp: , Rfl:   PAST MEDICAL HISTORY: Past Medical History:  Diagnosis Date   Diabetes ([place])    High blood pressure    Migraine variant with headache 06/15/2019    PAST SURGICAL HISTORY: Past Surgical History:  Procedure Laterality Date   ANKLE SURGERY Right 11/19/2015   HERNIA REPAIR  2019    FAMILY HISTORY: Family History  Problem Relation Age of Onset   Heart attack Mother    Diabetes Mother    High blood pressure Mother    Stroke Mother    Heart attack Sister    Diabetes Sister    High blood pressure Sister    Diabetes Brother    High blood pressure Brother    Stroke Maternal Grandmother    Cancer Father    __________________________________________________________________ __________________________________________________________________    PHYSICAL EXAM from  :  There were no vitals filed for this visit.   There is no height or weight on file to calculate BMI.   General: The patient is well-developed and well-nourished and in no acute distress  HEENT:  Head is [place]/AT.  Sclera are anicteric.    Skin: Extremities are without rash or  edema.   Neurologic Exam  Mental status: The patient is alert and oriented x 3 at the time of the examination. The patient has apparent  normal recent and remote memory, with an apparently normal attention span and concentration ability.   Speech is normal.  Cranial nerves: Extraocular movements are full. Pupils are equal, round, and reactive to light and accomodation.  Visual fields are full.  Facial symmetry is present. There is good facial sensation to soft touch bilaterally.Facial strength is normal.  Trapezius and sternocleidomastoid strength is normal. No dysarthria is  noted.  The tongue is midline, and the patient has symmetric elevation of the soft palate. No obvious hearing deficits are noted.  Motor:  Muscle bulk is normal.   Tone is increased in the left leg.. Strength is  5 / 5 in both arms but reduced in the legs, 4-/5 left iliopsoas, 4/5 left foot extension, and 4+/5 elsewhere in the left leg and 4+/5 in the right leg  Sensory: Sensory testing is intact to pinprick, soft touch and vibration sensation in the arms.  She has reduced vibration sensation in the left leg..  Coordination: Cerebellar testing reveals good finger-nose-finger and heel-to-shin bilaterally.  Gait and station: Station is normal.   Gait requires support.  She has a left foot drop.  She cannot tandem walk.. Romberg is negative.   Reflexes: Deep tendon reflexes are symmetric and normal in the arms but increased in the legs, left greater than right.  There are crossed adductor responses..   Plantar responses are right and extensor left.    DIAGNOSTIC DATA (LABS, IMAGING, TESTING) - I reviewed patient records, labs, notes, testing and imaging myself where available.  Lab Results  Component Value Date   WBC 5.1 10/29/2022   HGB 12.5 10/29/2022   HCT 38.4 10/29/2022   MCV 89 10/29/2022   PLT 291 10/29/2022      Component Value Date/Time   NA 141 10/29/2022 1639   K 4.3 10/29/2022 1639   CL 99 10/29/2022 1639   CO2 22 10/29/2022 1639   GLUCOSE 107 (H) 10/29/2022 1639   GLUCOSE 118 (H) 08/31/2019 1448   BUN 15 10/29/2022 1639   CREATININE  0.69 10/29/2022 1639   CREATININE 0.65 08/31/2019 1448   CALCIUM 9.5 10/29/2022 1639   PROT 7.4 10/29/2022 1639   ALBUMIN 4.6 10/29/2022 1639   AST 54 (H) 10/29/2022 1639   ALT 70 (H) 10/29/2022 1639   ALKPHOS 79 10/29/2022 1639   BILITOT 0.3 10/29/2022 1639   GFRNONAA 104 08/31/2019 1448   GFRAA 120 08/31/2019 1448   Lab Results  Component Value Date   CHOL 197 10/29/2022   HDL 58 10/29/2022   LDLCALC 119 (H) 10/29/2022   TRIG 115 10/29/2022   CHOLHDL 3.4 10/29/2022   Lab Results  Component Value Date   HGBA1C 8.3 (H) 08/31/2019   Lab Results  Component Value Date   RJJOACZY60 630 08/11/2019   Lab Results  Component Value Date   TSH 1.45 05/25/2019       ASSESSMENT AND PLAN  Multiple sclerosis ([place])  Gait disturbance  Thoracic disc disorder with myelopathy  Attention deficit disorder (ADD) without hyperactivity  Vertigo  Other fatigue   Due to combination of multiple sclerosis and thoracic compressive myelopathy, resulting in significant physical and cognitive impairment, she is unable to walk.  Physical impairments include leg weakness, numbness, very poor gait, urinary dysfunction.  She also has mild cognitive impairment and fatigue.  I do not think that she will be able to go back to an employed position.   She will continue [place] and do the second year of treatment around [place] later this year.  She will return to see [place] in a couple months for blood work.  Stay active and exercise as tolerated. She will return to see me as scheduled in about 2-3 months.  She should call sooner if new or worsening symptoms.   Follow Up Instructions: I discussed the assessment and treatment plan with the patient. The patient was provided an opportunity to ask  questions and all were answered. The patient agreed with the plan and demonstrated an understanding of the instructions.    The patient was advised to call back or seek an in-person evaluation if the  symptoms worsen or if the condition fails to improve as anticipated.  35-minute office visit with the majority of the time spent face-to-face for history and physical, discussion/counseling and decision-making.  Additional time with record review and documentation.   Reiley Keisler A. Felecia Shelling, MD, [facility] 07/26/3531, 9:92 PM Certified in Neurology, Clinical Neurophysiology, Sleep Medicine and Neuroimaging  [facility] [address] [place], [place] [address] [phone]

## 2023-02-05 ENCOUNTER — Other Ambulatory Visit: Payer: Self-pay | Admitting: Neurology

## 2023-02-05 MED ORDER — AMPHETAMINE-DEXTROAMPHET ER 15 MG PO CP24: 15.0000 mg | ORAL_CAPSULE | ORAL | 0 refills | Status: DC

## 2023-03-03 ENCOUNTER — Ambulatory Visit: Payer: BC Managed Care – PPO | Admitting: Neurology

## 2023-03-04 ENCOUNTER — Ambulatory Visit: Payer: Self-pay | Admitting: Neurology

## 2023-03-19 ENCOUNTER — Other Ambulatory Visit: Payer: Self-pay | Admitting: Neurology

## 2023-03-20 MED ORDER — AMPHETAMINE-DEXTROAMPHET ER 15 MG PO CP24: 15.0000 mg | ORAL_CAPSULE | ORAL | 0 refills | Status: DC

## 2023-03-20 NOTE — Telephone Encounter (Signed)
Last seen on 12/25/22 per note "Adderall helps some with focus and fatigue but she is still not to baseline "  Follow up scheduled on 04/03/23  Last filled on 02/05/23 # 30 tablets (30 days supply) Rx pending to signed

## 2023-03-27 ENCOUNTER — Encounter: Payer: Self-pay | Admitting: Neurology

## 2023-03-27 MED ORDER — AMPHETAMINE-DEXTROAMPHET ER 15 MG PO CP24: 15.0000 mg | ORAL_CAPSULE | ORAL | 0 refills | Status: DC

## 2023-04-03 ENCOUNTER — Ambulatory Visit: Payer: Self-pay | Admitting: Neurology

## 2023-04-03 NOTE — Patient Instructions (Addendum)
Below is our plan:  We will increase Adderall to 20mg  XR daily. Continue to focus on regular physical and mental exercise. We will plan to start second round of Mavenclad 08/2023. Continue follow up with Dr Lindley Magnus for incontinence.   Please make sure you are staying well hydrated. I recommend 50-60 ounces daily. Well balanced diet and regular exercise encouraged. Consistent sleep schedule with 6-8 hours recommended.   Please continue follow up with care team as directed.   Follow up with Dr Epimenio Foot in 3-4 months   You may receive a survey regarding today's visit. I encourage you to leave honest feed back as I do use this information to improve patient care. Thank you for seeing me today!

## 2023-04-03 NOTE — Progress Notes (Signed)
Chief Complaint  Patient presents with   Room 1    Pt is here Alone. Pt states that things have been okay since last Appointment. Pt states that she has had some flare up the most recent was last. Pt states that she was dizziness and fatigue. Pt states that she didn't have any nausea with her dizziness. Pt states that with her fatigue the Adderall didn't help at all with her energy. Pt states that she also had the the pins and needles feeling all over her body that would come and go.     HISTORY OF PRESENT ILLNESS:  04/08/23 ALL:  Bridget Lawrence is a 52 y.o. female here today for follow up for MS. She completed first two months of Mavenclad 08/2022 and 09/2022. Last lymph count 10/2022 was 1.  Last imaging in 2020 at diagnosis.   She reports symptoms are fairly stable. She continues to use bilateral arm crutches for support. No falls. L>R lower ext weakness. She continues to have intermittent dysesthesias. Not overly bothersome. Flexeril works better than baclofen for muscle cramps. She continues to have intermittent dizziness. BP has been fairly normal.   She does have occasional word finding difficulty and feels she has a hard time concentrating/focusing on work. She has pretty significant fatigue. She continues Adderall XR 15mg  daily. She is unsure that it helps much with fatigue. If she takes past 9am it causes insomnia. Last refill 03/31/2023 viewed on [facility] app, not recorded on [facility]. She is sleeping well. Mood is good. 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   She continues to have difficulty with urination. Tamsulosin helped with urinary retention in the past. She is followed by Dr Lindley Magnus, last seen 01/2023. He advised to continue tamsulosin and self cath 4-6 times daily. RTC in 10/2023 for renal ultrasound.   HISTORY (copied from Dr Bonnita Hollow previous note)  She has not been back to work since the spine surgery.  She was working as a Firefighter.      Due to her MS and  spinal cord damage, she has had worsening physical functioning.  She has left greater than right leg weakness, foot drops and requires support to walk, even short distances.  Although she is able to use crutches, she cannot go a long distance.  Additionally, she has severe urinary dysfunction that has led to multiple urinary tract infections.  Tamsulosin initially helped her urinary retention but she is now needing to self-catheterize.  She has had multiple UTIs.  She has bilateral leg numbness which contributes to her balance issues.  She also has vertigo that is likely related to the MS.  She does have a focus in the pons that could affect vestibular function.   She also has mild cognitive impairment and cognitive and physical fatigue related to the MS.  Adderall helps some with focus and fatigue but she is still not to baseline  She has pain from her head to toes on the left.   Whenever she gets an infection, she is much worse physically, pain-wise and cognitively for a week or more.    She has had multiple infections leading to worse functioning.     She did her first Mavenclad treatments October and November and tolerated it well.  Lymphocyte count dropped to 1.0 but not into the lower levels.  She will have a second year Mavenclad later this year.   She sleeps well.     She is  occasioanlly moody but denies actual depression.    Cognitively she sometimes has word finding difficulty and making some errors with processing (she stopped day-trading due to this).  She had been doing day-trading and noted last year, that she made a couple mistakes that she would not have made a year earlier.   MS History: She is a 52 year old woman who had an inflamed sensation in the entire left side (leg greater than face and all) that was present when she woke up 1 day in 2020.    She was fine the next day but symptoms recurred a couple more times and she sought medical attention.   She also noted dizziness and nausea  when she turned her head to the left (starting in 2020 and lasting until 2022).   She also had issues with her balance.    She had a thoracic MRI in December 2020 showing spinal cord compression at T7-T8 and T8-T9 (I do not have those images but the radiology report mentions myelopathic signal within the spinal cord at those levels).    She had surgery [facility] 2021 (T6-T9 laminectomy - Dr. Alden Hipp).    She felt she improved after the surgery.   Gait improved.   She was also having anemia and other laboratory abnormalities and found to have multiple fibroids.   She had a hysterectomy late 2021.    She had more neurologic symptoms and gait issues and repeat MRI T-spine 06/08/2021 showed continued severe stenosis and she had a second spinal operaion with T3-T11 decompression and fusion.  She has had difficulty recovering from that surgery.  SInce the surgery, she has has had difficulty walking but has improved from wheelchair to walker to crutches.   Due to her dizziness, she had MRA of the neck 10/09/2021 that was normal and a repeat brain MRI 01/14/2022 showing progression including an enhancing lesion worrisome for demyelination..  Dynamic angiography had not shown abnormality to explain her dizziness.  Those symptoms have actually improved some.   She was able to log into MyChart to show me the most recent brain MRI scan.  I compared it with the MRI from 05/31/2019.  During the interim she developed several new lesions in the hemispheres and 1 more in the pons.  There was a classic periventricular focus.  Additionally, 1 new lesion in the posterior left frontal lobe showed closed ring enhancement.   Vascular risks:   Type 2 NIDDM, HTN.   No smoking.     She has FH of MS (brother and his daughter).      She had a suspected PE in 2021 and CTA showed lymphadenopathy and one was metabolically active on PET - this is monitored with serial CT   Imaging: MRI of the head 05/31/2019 shows foci in the right  lower pons, and 3 or 4 T2/FLAIR hyperintense foci in the hemispheres, one oval-shaped in the deep white matter of the left posterior frontal lobe and 1 in the juxtacortical right frontal lobe.  The overall pattern is nonspecific but consistent with MS.   MRI of the cervical spine 09/05/2019 showed a normal spinal cord and a large herniated disc at C7-T1 causing mild spinal stenosis but not spinal cord compression.  There was no nerve root compression at that level.  There was mild DJD elsewhere in the cervical spine did not cause any spinal stenosis or nerve root compression. Try to stop by MRI report of the thoracic spine from 06/08/2021 showed  severe spinal stenosis at T8-T9 despite posterior laminectomy and moderately severe spinal stenosis at T9-T10 without spinal cord compression.   MRI brain 01/14/2022 showed interval increase in foci including a 6 mm enhancing focus left hemisphere and tiny new non-enhancing pontine and a couple hemispheric foci not present in 2020  REVIEW OF SYSTEMS: Out of a complete 14 system review of symptoms, the patient complains only of the following symptoms, dizziness, fatigue, word finding difficulty, difficulty with attention and focus, left sided weakness, gait instability, urinary retention and all other reviewed systems are negative.   ALLERGIES: Allergies  Allergen Reactions   Wound Dressing Adhesive Hives, Itching, Rash and Swelling   Doxycycline Other (See Comments)    Pt states," I don't remember.'     Gabapentin Other (See Comments)    Slurred speech.      Topiramate Other (See Comments)    Tingling of fingers and toes /altered taste and smell       HOME MEDICATIONS: Outpatient Medications Prior to Visit  Medication Sig Dispense Refill   amLODipine (NORVASC) 5 MG tablet Take 5 mg by mouth daily.     atenolol (TENORMIN) 50 MG tablet Take 50 mg by mouth daily.     Cladribine, 10 Tabs, (MAVENCLAD, 10 TABS,) 10 MG TBPK Take 20 mg by mouth  daily. Take 20mg  PO on days 1-5 on month 1. Take 20mg  PO on days 1-5 on month 2. Patien'ts first mavenclad dose Y1M1 was 08/28/22.     cyclobenzaprine (FLEXERIL) 5 MG tablet Take 1-2 tablets (5-10 mg total) by mouth at bedtime. 60 tablet 11   pantoprazole (PROTONIX) 40 MG tablet Take 40 mg by mouth daily.     Probiotic Product (PROBIOTIC-10) CAPS Take by mouth.     Semaglutide, 2 MG/DOSE, (OZEMPIC, 2 MG/DOSE,) 8 MG/3ML SOPN Inject 1 Dose into the skin once a week.     amphetamine-dextroamphetamine (ADDERALL XR) 15 MG 24 hr capsule Take 1 capsule by mouth every morning. 30 capsule 0   Cholecalciferol (VITAMIN D) 2000 units CAPS Take by mouth.     ipratropium-albuterol (DUONEB) 0.5-2.5 (3) MG/3ML SOLN Inhale 3 mLs into the lungs every 4 (four) hours as needed. (Patient not taking: Reported on 04/08/2023) 360 mL 3   metFORMIN (GLUCOPHAGE) 1000 MG tablet Take 1,000 mg by mouth 2 (two) times daily with a meal.     No facility-administered medications prior to visit.     PAST MEDICAL HISTORY: Past Medical History:  Diagnosis Date   Diabetes (HCC)    High blood pressure    Migraine variant with headache 06/15/2019     PAST SURGICAL HISTORY: Past Surgical History:  Procedure Laterality Date   ANKLE SURGERY Right 11/19/2015   HERNIA REPAIR  2019     FAMILY HISTORY: Family History  Problem Relation Age of Onset   Heart attack Mother    Diabetes Mother    High blood pressure Mother    Stroke Mother    Cancer Father    Heart attack Sister    Diabetes Sister    High blood pressure Sister    Diabetes Brother    High blood pressure Brother    Multiple sclerosis Brother    Stroke Maternal Grandmother    Multiple sclerosis Niece      SOCIAL HISTORY: Social History   Socioeconomic History   Marital status: Married    Spouse name: Bibiana Finan   Number of children: 0   Years of education: Not on file  Highest education level: Not on file  Occupational History   Not on file   Tobacco Use   Smoking status: Never   Smokeless tobacco: Never  Vaping Use   Vaping Use: Never used  Substance and Sexual Activity   Alcohol use: Yes    Comment: 4/wk   Drug use: Never   Sexual activity: Yes    Partners: Female    Birth control/protection: None  Other Topics Concern   Not on file  Social History Narrative   Left handed   Caffeine~ less than half a cup   Lives at home with spouse susan    Social Determinants of Health   Financial Resource Strain: Not on file  Food Insecurity: Not on file  Transportation Needs: Not on file  Physical Activity: Not on file  Stress: Not on file  Social Connections: Not on file  Intimate Partner Violence: Not on file     PHYSICAL EXAM  Vitals:   04/08/23 1115  BP: 129/80  Pulse: 80  Weight: 249 lb 8 oz (113.2 kg)  Height: 5\' 7"  (1.702 m)   Body mass index is 39.08 kg/m.  Generalized: Well developed, in no acute distress  Cardiology: normal rate and rhythm, no murmur auscultated  Respiratory: clear to auscultation bilaterally    Neurological examination  Mentation: Alert oriented to time, place, history taking. Follows all commands speech and language fluent Cranial nerve II-XII: Pupils were equal round reactive to light. Extraocular movements were full, visual field were full on confrontational test. Facial sensation and strength were normal. Uvula tongue midline. Head turning and shoulder shrug  were normal and symmetric. Motor: The motor testing reveals 5 over 5 strength of all 4 extremities. With exception of left hip flexion 4+/5 Increased motor tone left lower ext.  Sensory: Sensory testing is intact to soft touch on all 4 extremities. No evidence of extinction is noted.  Coordination: Cerebellar testing reveals good finger-nose-finger and heel-to-shin bilaterally.  Gait and station: Able to push to standing position. Gait is stable with bilateral arm crutches. Left foot drop. Unable to tandem  Reflexes: Deep  tendon reflexes are symmetric and normal bilaterally.    DIAGNOSTIC DATA (LABS, IMAGING, TESTING) - I reviewed patient records, labs, notes, testing and imaging myself where available.  Lab Results  Component Value Date   WBC 5.1 10/29/2022   HGB 12.5 10/29/2022   HCT 38.4 10/29/2022   MCV 89 10/29/2022   PLT 291 10/29/2022      Component Value Date/Time   NA 141 10/29/2022 1639   K 4.3 10/29/2022 1639   CL 99 10/29/2022 1639   CO2 22 10/29/2022 1639   GLUCOSE 107 (H) 10/29/2022 1639   GLUCOSE 118 (H) 08/31/2019 1448   BUN 15 10/29/2022 1639   CREATININE 0.69 10/29/2022 1639   CREATININE 0.65 08/31/2019 1448   CALCIUM 9.5 10/29/2022 1639   PROT 7.4 10/29/2022 1639   ALBUMIN 4.6 10/29/2022 1639   AST 54 (H) 10/29/2022 1639   ALT 70 (H) 10/29/2022 1639   ALKPHOS 79 10/29/2022 1639   BILITOT 0.3 10/29/2022 1639   GFRNONAA 104 08/31/2019 1448   GFRAA 120 08/31/2019 1448   Lab Results  Component Value Date   CHOL 197 10/29/2022   HDL 58 10/29/2022   LDLCALC 119 (H) 10/29/2022   TRIG 115 10/29/2022   CHOLHDL 3.4 10/29/2022   Lab Results  Component Value Date   HGBA1C 8.3 (H) 08/31/2019   Lab Results  Component Value Date  [id number] 743 08/11/2019   Lab Results  Component Value Date   TSH 1.45 05/25/2019        No data to display               No data to display           ASSESSMENT AND PLAN  52 y.o. year old female  has a past medical history of Diabetes (HCC), High blood pressure, and Migraine variant with headache (06/15/2019). here with    Multiple sclerosis (HCC) - Plan: CBC with Differential/Platelets, CMP  Gait disturbance  Thoracic disc disorder with myelopathy  Attention deficit disorder (ADD) without hyperactivity  Vertigo  Other fatigue  High risk medication use  Neurogenic bladder  Kihana Gladwin reports symptoms are fairly stable We will update labs, today. She will plan to start second cycle of Mavenclad 08/2023. I  will increase Adderall to 20mg  XR daily. Advised to monitor BP and for worsening insomnia. May continue flexeril. Continue follow up with urology as directed. Clean self cath advised 4-6 times daily. Healthy lifestyle habits encouraged. She will follow up with PCP and care team as directed. She will return to see Dr Epimenio Foot in 3-4 months, sooner if needed. She verbalizes understanding and agreement with this plan.   Orders Placed This Encounter  Procedures   CBC with Differential/Platelets   CMP     Meds ordered this encounter  Medications   amphetamine-dextroamphetamine (ADDERALL XR) 20 MG 24 hr capsule    Sig: Take 1 capsule (20 mg total) by mouth daily.    Dispense:  30 capsule    Refill:  0    Order Specific Question:   Supervising Provider    Answer:   Anson Fret [[id number]]     Shawnie Dapper, MSN, FNP-C 04/08/2023, 4:07 PM  [facility] [address], [address] [place], [place] [address] [phone]

## 2023-04-08 ENCOUNTER — Telehealth: Payer: Self-pay | Admitting: Family Medicine

## 2023-04-08 ENCOUNTER — Encounter: Payer: Self-pay | Admitting: Family Medicine

## 2023-04-08 ENCOUNTER — Other Ambulatory Visit: Payer: Self-pay

## 2023-04-08 ENCOUNTER — Ambulatory Visit (INDEPENDENT_AMBULATORY_CARE_PROVIDER_SITE_OTHER): Payer: Self-pay | Admitting: Family Medicine

## 2023-04-08 VITALS — BP 129/80 | HR 80 | Ht 67.0 in | Wt 249.5 lb

## 2023-04-08 DIAGNOSIS — M5104 Intervertebral disc disorders with myelopathy, thoracic region: Secondary | ICD-10-CM

## 2023-04-08 DIAGNOSIS — R5383 Other fatigue: Secondary | ICD-10-CM

## 2023-04-08 DIAGNOSIS — G35 Multiple sclerosis: Secondary | ICD-10-CM

## 2023-04-08 DIAGNOSIS — R269 Unspecified abnormalities of gait and mobility: Secondary | ICD-10-CM

## 2023-04-08 DIAGNOSIS — R42 Dizziness and giddiness: Secondary | ICD-10-CM

## 2023-04-08 DIAGNOSIS — N319 Neuromuscular dysfunction of bladder, unspecified: Secondary | ICD-10-CM

## 2023-04-08 DIAGNOSIS — F988 Other specified behavioral and emotional disorders with onset usually occurring in childhood and adolescence: Secondary | ICD-10-CM

## 2023-04-08 DIAGNOSIS — Z79899 Other long term (current) drug therapy: Secondary | ICD-10-CM

## 2023-04-08 MED ORDER — AMPHETAMINE-DEXTROAMPHET ER 20 MG PO CP24: 20.0000 mg | ORAL_CAPSULE | Freq: Every day | ORAL | 0 refills | Status: DC

## 2023-04-08 NOTE — Telephone Encounter (Signed)
Pt stated at check out that she forgot to mention during her visit that she's having issues with her bladder and she believes it could be related to MS. Pt would like to know if there's anything that would be able to help with that

## 2023-04-09 LAB — COMPREHENSIVE METABOLIC PANEL
ALT: 81 IU/L — ABNORMAL HIGH (ref 0–32)
AST: 65 IU/L — ABNORMAL HIGH (ref 0–40)
Albumin/Globulin Ratio: 1.4 (ref 1.2–2.2)
Albumin: 4.3 g/dL (ref 3.8–4.9)
Alkaline Phosphatase: 95 IU/L (ref 44–121)
BUN/Creatinine Ratio: 16 (ref 9–23)
BUN: 11 mg/dL (ref 6–24)
Bilirubin Total: 0.4 mg/dL (ref 0.0–1.2)
CO2: 23 mmol/L (ref 20–29)
Calcium: 9.3 mg/dL (ref 8.7–10.2)
Chloride: 101 mmol/L (ref 96–106)
Creatinine, Ser: 0.67 mg/dL (ref 0.57–1.00)
Globulin, Total: 3 g/dL (ref 1.5–4.5)
Glucose: 260 mg/dL — ABNORMAL HIGH (ref 70–99)
Potassium: 4.4 mmol/L (ref 3.5–5.2)
Sodium: 138 mmol/L (ref 134–144)
Total Protein: 7.3 g/dL (ref 6.0–8.5)
eGFR: 104 mL/min/{1.73_m2} (ref >59)

## 2023-04-09 LAB — CBC WITH DIFFERENTIAL/PLATELET
Basophils Absolute: 0 10*3/uL (ref 0.0–0.2)
Basos: 1 %
EOS (ABSOLUTE): 0.1 10*3/uL (ref 0.0–0.4)
Eos: 2 %
Hematocrit: 40 % (ref 34.0–46.6)
Hemoglobin: 12.7 g/dL (ref 11.1–15.9)
Immature Grans (Abs): 0 10*3/uL (ref 0.0–0.1)
Immature Granulocytes: 0 %
Lymphocytes Absolute: 1.2 10*3/uL (ref 0.7–3.1)
Lymphs: 29 %
MCH: 27.7 pg (ref 26.6–33.0)
MCHC: 31.8 g/dL (ref 31.5–35.7)
MCV: 87 fL (ref 79–97)
Monocytes Absolute: 0.4 10*3/uL (ref 0.1–0.9)
Monocytes: 10 %
Neutrophils Absolute: 2.3 10*3/uL (ref 1.4–7.0)
Neutrophils: 58 %
Platelets: 266 10*3/uL (ref 150–450)
RBC: 4.59 x10E6/uL (ref 3.77–5.28)
RDW: 13.5 % (ref 11.7–15.4)
WBC: 4 10*3/uL (ref 3.4–10.8)

## 2023-05-25 ENCOUNTER — Encounter: Payer: Self-pay | Admitting: Family Medicine

## 2023-05-26 ENCOUNTER — Other Ambulatory Visit: Payer: Self-pay

## 2023-05-26 MED ORDER — AMPHETAMINE-DEXTROAMPHET ER 20 MG PO CP24: 20.0000 mg | ORAL_CAPSULE | Freq: Every day | ORAL | 0 refills | Status: DC

## 2023-06-18 ENCOUNTER — Telehealth: Payer: Self-pay | Admitting: *Deleted

## 2023-06-18 NOTE — Telephone Encounter (Signed)
Service request form for year 2.  Completed to be signed.

## 2023-07-03 NOTE — Telephone Encounter (Signed)
Received year 2 form again, form signed and faxed to [employer] [phone]. Confirmation received.

## 2023-07-10 ENCOUNTER — Encounter: Payer: Self-pay | Admitting: Neurology

## 2023-07-14 ENCOUNTER — Other Ambulatory Visit (HOSPITAL_COMMUNITY): Payer: Self-pay

## 2023-07-14 ENCOUNTER — Telehealth: Payer: Self-pay

## 2023-07-14 ENCOUNTER — Other Ambulatory Visit: Payer: Self-pay | Admitting: Neurology

## 2023-07-14 MED ORDER — AMPHETAMINE-DEXTROAMPHET ER 20 MG PO CP24: 20.0000 mg | ORAL_CAPSULE | Freq: Every day | ORAL | 0 refills | Status: DC

## 2023-07-14 NOTE — Addendum Note (Signed)
Addended by: Asa Lente on: 07/14/2023 03:23 PM   Modules accepted: Orders

## 2023-07-14 NOTE — Addendum Note (Signed)
Addended by: Asa Lente on: 07/14/2023 03:30 PM   Modules accepted: Orders

## 2023-07-14 NOTE — Telephone Encounter (Signed)
Pharmacy Patient Advocate Encounter   Received notification from CoverMyMeds that prior authorization for Mavenclad (10 Tabs) 10MG  tablets is required/requested.   Insurance verification completed.   The patient is insured through [facility] .   Per test claim: PA required; PA submitted to [employer] via CoverMyMeds Key/confirmation #/EOC [id number] Status is pending

## 2023-07-14 NOTE — Telephone Encounter (Signed)
Please also see message from patient. I have attached adderall for refill as well

## 2023-07-16 ENCOUNTER — Encounter: Payer: Self-pay | Admitting: Neurology

## 2023-07-17 ENCOUNTER — Ambulatory Visit: Payer: Self-pay | Admitting: Neurology

## 2023-07-18 ENCOUNTER — Encounter: Payer: Self-pay | Admitting: Neurology

## 2023-07-21 NOTE — Progress Notes (Unsigned)
[facility]  PATIENT: Bridget Lawrence DOB: 1971/05/29  REFERRING DOCTOR OR PCP:   Edythe Lynn, MD SOURCE: Patient, notes from Dr. Larose Kells, imaging and lab reports, multiple MRI scans personally reviewed.  _________________________________   HISTORICAL  CHIEF COMPLAINT:  No chief complaint on file.   HISTORY OF PRESENT ILLNESS:  Bridget Lawrence, at the [facility] at [facility] for neurologic second opinion regarding the possibility of multiple sclerosis.    Update 07/22/2023: She did her first Mavenclad treatments October and November and tolerated it well.  Lymphocyte count dropped to 1.0.  She will have a second year Mavenclad later this year and will need lab work today to rule out chronic infections before dosing..  Since last visit, she has had MRI of the thoracic and lumbar spine.  The thoracic spine showed the instrumentation from T4-T11 but no acute abnormality.  The MRI of the lumbar spine showed multilevel degenerative changes, by report unchanged compared to the MRI from 05/06/2022.  In 2023, degenerative changes and lipomatosis leads to moderately severe spinal stenosis at L3-L4, L4-L5 and L5-S1.  Due to her MS and spinal cord damage, she has had worsening physical functioning.  She has left greater than right leg weakness, foot drops and requires support to walk, even short distances.  Although she is able to use crutches, she cannot go a long distance.  Additionally, she has severe urinary dysfunction that has led to multiple urinary tract infections.  Tamsulosin initially helped her urinary retention but she is now needing to self-catheterize.  She has had multiple UTIs.  She has bilateral leg numbness which contributes to her balance issues.  She also has vertigo that is likely related to the MS.  She does have a focus in the pons that could affect vestibular function.  She also has mild cognitive impairment and cognitive and physical fatigue  related to the MS.  Adderall helps some with focus and fatigue but she is still not to baseline  She has pain from her head to toes on the left.   Whenever she gets an infection, she is much worse physically, pain-wise and cognitively for a week or more.    She has had multiple infections leading to worse functioning.    She sleeps well.     She is occasionally moody but denies actual depression.    Cognitively she sometimes has word finding difficulty and making some errors with processing (she stopped day-trading due to this).  She had been doing day-trading and noted last year, that she made a couple mistakes that she would not have made a year earlier.  MS History: She is a 52 year old woman who had an inflamed sensation in the entire left side (leg greater than face and all) that was present when she woke up 1 day in 2020.    She was fine the next day but symptoms recurred a couple more times and she sought medical attention.   She also noted dizziness and nausea when she turned her head to the left (starting in 2020 and lasting until 2022).   She also had issues with her balance.    She had a thoracic MRI in December 2020 showing spinal cord compression at T7-T8 and T8-T9 (I do not have those images but the radiology report mentions myelopathic signal within the spinal cord at those levels).    She had surgery at [facility] February 2021 (T6-T9 laminectomy - Dr. Alden Hipp).    She felt  she improved after the surgery.   Gait improved.   She was also having anemia and other laboratory abnormalities and found to have multiple fibroids.   She had a hysterectomy late 2021.    She had more neurologic symptoms and gait issues and repeat MRI T-spine 06/08/2021 showed continued severe stenosis and she had a second spinal operaion with T3-T11 decompression and fusion.  She has had difficulty recovering from that surgery.  SInce the surgery, she has has had difficulty walking but has improved from wheelchair to walker to  crutches.   Due to her dizziness, she had MRA of the neck 10/09/2021 that was normal and a repeat brain MRI 01/14/2022 showing progression including an enhancing lesion worrisome for demyelination..  Dynamic angiography had not shown abnormality to explain her dizziness.  Those symptoms have actually improved some.  I compared this 2023 MRI with an MRI from 05/31/2019.  During the interim she developed several new lesions in the hemispheres and 1 more in the pons.  There was a classic periventricular focus.  Additionally, 1 new lesion in the posterior left frontal lobe showed closed ring enhancement.  DMT: Initially in June 2023, when I first saw her, we had discussed several options and she wished to begin Vumerity.  However, she had difficulty tolerating it and she was switched to Mavenclad October/November 2023 for year 1 and will do year 2 October/November 2024.   Vascular risks:   Type 2 NIDDM, HTN.   No smoking.    She has FH of MS (brother and his daughter).     She had a suspected PE in 2021 and CTA showed lymphadenopathy and one was metabolically active on PET - this is monitored with serial CT  Imaging: MRI of the head 05/31/2019 shows foci in the right lower pons, and 3 or 4 T2/FLAIR hyperintense foci in the hemispheres, one oval-shaped in the deep white matter of the left posterior frontal lobe and 1 in the juxtacortical right frontal lobe.  The overall pattern is nonspecific but consistent with MS.  MRI of the cervical spine 09/05/2019 showed a normal spinal cord and a large herniated disc at C7-T1 causing mild spinal stenosis but not spinal cord compression.  There was no nerve root compression at that level.  There was mild DJD elsewhere in the cervical spine did not cause any spinal stenosis or nerve root compression.  MRI report of the thoracic spine from 06/08/2021 showed severe spinal stenosis at T8-T9 despite posterior laminectomy and moderately severe spinal stenosis at T9-T10  without spinal cord compression.  MRI brain 01/14/2022 showed interval increase in foci including a 6 mm enhancing focus left hemisphere and tiny new non-enhancing pontine and a couple hemispheric foci not present in 2020  MRI of the lumbar spine 07/12/2023 showed  1. 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  2.  Moderate facet joint arthritis with some thickening of the ligamentum flavum from L3-4 and L4-5.  3.  Bilateral subarticular stenoses L4-5 and L5-S1.  4.  Bilateral foraminal stenoses L5-S1, worse on the left.  ----I do not have the images but the radiologist felt that the findings were not significantly changed from the prior examination on 05/06/2022.  I do have the 2023 images and note that the epidural lipomatosis does not lead to significant spinal stenosis at L3-L4 and L4-L5 and L5-S1.  MRI of the thoracic spine 07/12/2023 showed posterior instrumentation from T4  through T11. Mild thoracic spondylosis. No compression fracture, herniated disc, or acute abnormality.    REVIEW OF SYSTEMS: Constitutional: No fevers, chills, sweats, or change in appetite Eyes: No visual changes, double vision, eye pain Ear, nose and throat: No hearing loss, ear pain, nasal congestion, sore throat Cardiovascular: No chest pain, palpitations Respiratory:  No shortness of breath at rest or with exertion.   No wheezes GastrointestinaI: No nausea, vomiting, diarrhea, abdominal pain, fecal incontinence Genitourinary:  No dysuria, urinary retention or frequency.  No nocturia. Musculoskeletal:  No neck pain, back pain Integumentary: No rash, pruritus, skin lesions Neurological: as above Psychiatric: No depression at this time.  No anxiety Endocrine: No palpitations, diaphoresis, change in appetite, change in weigh or increased thirst Hematologic/Lymphatic:  No anemia, purpura, petechiae. Allergic/Immunologic: No itchy/runny eyes,  nasal congestion, recent allergic reactions, rashes  ALLERGIES: Allergies  Allergen Reactions   Wound Dressing Adhesive Hives, Itching, Rash and Swelling   Doxycycline Other (See Comments)    Pt states," I don't remember.'     Gabapentin Other (See Comments)    Slurred speech.      Topiramate Other (See Comments)    Tingling of fingers and toes /altered taste and smell      HOME MEDICATIONS:  Current Outpatient Medications:    amLODipine (NORVASC) 5 MG tablet, Take 5 mg by mouth daily., Disp: , Rfl:    amphetamine-dextroamphetamine (ADDERALL XR) 20 MG 24 hr capsule, Take 1 capsule (20 mg total) by mouth daily., Disp: 30 capsule, Rfl: 0   atenolol (TENORMIN) 50 MG tablet, Take 50 mg by mouth daily., Disp: , Rfl:    Cholecalciferol (VITAMIN D) 2000 units CAPS, Take by mouth., Disp: , Rfl:    Cladribine, 10 Tabs, (MAVENCLAD, 10 TABS,) 10 MG TBPK, Take 20 mg by mouth daily. Take 20mg  PO on days 1-5 on month 1. Take 20mg  PO on days 1-5 on month 2. Patien'ts first mavenclad dose Y1M1 was 08/28/22., Disp: , Rfl:    cyclobenzaprine (FLEXERIL) 5 MG tablet, Take 1-2 tablets (5-10 mg total) by mouth at bedtime., Disp: 60 tablet, Rfl: 11   ipratropium-albuterol (DUONEB) 0.5-2.5 (3) MG/3ML SOLN, Inhale 3 mLs into the lungs every 4 (four) hours as needed. (Patient not taking: Reported on 04/08/2023), Disp: 360 mL, Rfl: 3   metFORMIN (GLUCOPHAGE) 1000 MG tablet, Take 1,000 mg by mouth 2 (two) times daily with a meal., Disp: , Rfl:    pantoprazole (PROTONIX) 40 MG tablet, Take 40 mg by mouth daily., Disp: , Rfl:    Probiotic Product (PROBIOTIC-10) CAPS, Take by mouth., Disp: , Rfl:    Semaglutide, 2 MG/DOSE, (OZEMPIC, 2 MG/DOSE,) 8 MG/3ML SOPN, Inject 1 Dose into the skin once a week., Disp: , Rfl:   PAST MEDICAL HISTORY: Past Medical History:  Diagnosis Date   Diabetes (HCC)    High blood pressure    Migraine variant with headache 06/15/2019    PAST SURGICAL HISTORY: Past Surgical History:   Procedure Laterality Date   ANKLE SURGERY Right 11/19/2015   HERNIA REPAIR  2019    FAMILY HISTORY: Family History  Problem Relation Age of Onset   Heart attack Mother    Diabetes Mother    High blood pressure Mother    Stroke Mother    Cancer Father    Heart attack Sister    Diabetes Sister    High blood pressure Sister    Diabetes Brother    High blood pressure Brother    Multiple sclerosis Brother  Stroke Maternal Grandmother    Multiple sclerosis Niece    __________________________________________________________________ __________________________________________________________________    PHYSICAL EXAM from initial visit:  There were no vitals filed for this visit.    There is no height or weight on file to calculate BMI.   General: The patient is well-developed and well-nourished and in no acute distress  HEENT:  Head is [place]/AT.  Sclera are anicteric.  .  Skin: Extremities are without rash or  edema.  Musculoskeletal:  Back is nontender  Neurologic Exam  Mental status: The patient is alert and oriented x 3 at the time of the examination. The patient has apparent normal recent and remote memory, with an apparently normal attention span and concentration ability.   Speech is normal.  Cranial nerves: Extraocular movements are full. Pupils are equal, round, and reactive to light and accomodation.  Facial strength and sensation was normal.  No obvious hearing deficits are noted.  Motor:  Muscle bulk is normal.   Tone is increased in the left leg.. Strength is  5 / 5 in both arms but reduced in the legs, 4-/5 left iliopsoas, 4/5 left foot extension, and 4+/5 elsewhere in the left leg and 4+/5 in the right leg  Sensory: Sensory testing is intact to pinprick, soft touch and vibration sensation in the arms.  She has reduced vibration sensation in the left leg..  Coordination: Cerebellar testing reveals good finger-nose-finger and heel-to-shin bilaterally.  Gait  and station: Station is normal.   Gait requires support and she uses bilateral crutches.  She has a left foot drop.   Romberg is negative.   Reflexes: Deep tendon reflexes are symmetric and normal in the arms but increased in the legs, left greater than right.  There are crossed adductor responses..   Plantar responses extensor on the left    DIAGNOSTIC DATA (LABS, IMAGING, TESTING) - I reviewed patient records, labs, notes, testing and imaging myself where available.  Lab Results  Component Value Date   WBC 4.0 04/08/2023   HGB 12.7 04/08/2023   HCT 40.0 04/08/2023   MCV 87 04/08/2023   PLT 266 04/08/2023      Component Value Date/Time   NA 138 04/08/2023 1144   K 4.4 04/08/2023 1144   CL 101 04/08/2023 1144   CO2 23 04/08/2023 1144   GLUCOSE 260 (H) 04/08/2023 1144   GLUCOSE 118 (H) 08/31/2019 1448   BUN 11 04/08/2023 1144   CREATININE 0.67 04/08/2023 1144   CREATININE 0.65 08/31/2019 1448   CALCIUM 9.3 04/08/2023 1144   PROT 7.3 04/08/2023 1144   ALBUMIN 4.3 04/08/2023 1144   AST 65 (H) 04/08/2023 1144   ALT 81 (H) 04/08/2023 1144   ALKPHOS 95 04/08/2023 1144   BILITOT 0.4 04/08/2023 1144   GFRNONAA 104 08/31/2019 1448   GFRAA 120 08/31/2019 1448   Lab Results  Component Value Date   CHOL 197 10/29/2022   HDL 58 10/29/2022   LDLCALC 119 (H) 10/29/2022   TRIG 115 10/29/2022   CHOLHDL 3.4 10/29/2022   Lab Results  Component Value Date   HGBA1C 8.3 (H) 08/31/2019   Lab Results  Component Value Date   VITAMINB12 743 08/11/2019   Lab Results  Component Value Date   TSH 1.45 05/25/2019       ASSESSMENT AND PLAN  No diagnosis found.   She completed the first year of Mavenclad.  She will need lab work today for chronic infections, CBC/differential and LFT. Stay active and exercise as tolerated.  She has some attention deficit.  Consider a stimulant if it worsens. She will return to see me in 4 months.  She should call sooner for new or worsening  neurologic    Sundai Probert A. Epimenio Foot, MD, [facility] 07/21/2023, 3:46 PM Certified in Neurology, Clinical Neurophysiology, Sleep Medicine and Neuroimaging  [facility] [address], [address] [place], [place] [address] [phone]

## 2023-07-22 ENCOUNTER — Encounter: Payer: Self-pay | Admitting: Neurology

## 2023-07-22 ENCOUNTER — Other Ambulatory Visit: Payer: Self-pay

## 2023-07-22 ENCOUNTER — Ambulatory Visit (INDEPENDENT_AMBULATORY_CARE_PROVIDER_SITE_OTHER): Payer: Self-pay | Admitting: Neurology

## 2023-07-22 ENCOUNTER — Other Ambulatory Visit (HOSPITAL_COMMUNITY): Payer: Self-pay

## 2023-07-22 VITALS — BP 123/77 | HR 107 | Ht 67.0 in | Wt 242.5 lb

## 2023-07-22 DIAGNOSIS — R29898 Other symptoms and signs involving the musculoskeletal system: Secondary | ICD-10-CM

## 2023-07-22 DIAGNOSIS — F988 Other specified behavioral and emotional disorders with onset usually occurring in childhood and adolescence: Secondary | ICD-10-CM

## 2023-07-22 DIAGNOSIS — R5383 Other fatigue: Secondary | ICD-10-CM

## 2023-07-22 DIAGNOSIS — Z79899 Other long term (current) drug therapy: Secondary | ICD-10-CM

## 2023-07-22 DIAGNOSIS — R269 Unspecified abnormalities of gait and mobility: Secondary | ICD-10-CM

## 2023-07-22 DIAGNOSIS — M48062 Spinal stenosis, lumbar region with neurogenic claudication: Secondary | ICD-10-CM

## 2023-07-22 DIAGNOSIS — G35 Multiple sclerosis: Secondary | ICD-10-CM

## 2023-07-22 MED ORDER — AMPHETAMINE-DEXTROAMPHET ER 25 MG PO CP24: 25.0000 mg | ORAL_CAPSULE | Freq: Every day | ORAL | 0 refills | Status: DC

## 2023-07-22 NOTE — Telephone Encounter (Signed)
Received a request for Add information-Submitted along with clinicals and faxed to [phone].

## 2023-07-23 ENCOUNTER — Telehealth: Payer: Self-pay | Admitting: Neurology

## 2023-07-23 DIAGNOSIS — Z0271 Encounter for disability determination: Secondary | ICD-10-CM

## 2023-07-23 NOTE — Telephone Encounter (Signed)
self-pay sent to GI [phone] 

## 2023-07-24 ENCOUNTER — Telehealth: Payer: Self-pay

## 2023-07-24 NOTE — Telephone Encounter (Signed)
Called pt and let her know her lab results and that Dr. Epimenio Foot said:  1.  However, lab work shows that her liver function test are elevated (they were just slightly elevated in may but are a little higher currently).  The hepatitis labs are fine.  Looking at her medicine list I do not see any medicine that should cause this.  Therefore, I would like her to discuss further with her primary care to see if additional evaluation is needed.  Please get the name of her primary care so that the labs can be faxed to them and she can make an appointment..  2.  Also the lymphocyte count is still a little bit low.  It can sometimes take over a year for it to get back into the normal range but we do not like to do the second year Mavenclad until it is back in the normal range (greater than 0.8).  This will need to be rechecked in about 2 months to see if she can do the second year

## 2023-07-25 ENCOUNTER — Other Ambulatory Visit (HOSPITAL_COMMUNITY): Payer: Self-pay

## 2023-07-25 LAB — CBC WITH DIFFERENTIAL/PLATELET
Basophils Absolute: 0 10*3/uL (ref 0.0–0.2)
Basos: 0 %
EOS (ABSOLUTE): 0.1 10*3/uL (ref 0.0–0.4)
Eos: 1 %
Hematocrit: 40.7 % (ref 34.0–46.6)
Hemoglobin: 12.6 g/dL (ref 11.1–15.9)
Immature Grans (Abs): 0 10*3/uL (ref 0.0–0.1)
Immature Granulocytes: 0 %
Lymphocytes Absolute: 0.5 10*3/uL — ABNORMAL LOW (ref 0.7–3.1)
Lymphs: 9 %
MCH: 29 pg (ref 26.6–33.0)
MCHC: 31 g/dL — ABNORMAL LOW (ref 31.5–35.7)
MCV: 94 fL (ref 79–97)
Monocytes Absolute: 0.4 10*3/uL (ref 0.1–0.9)
Monocytes: 7 %
Neutrophils Absolute: 4.4 10*3/uL (ref 1.4–7.0)
Neutrophils: 83 %
Platelets: 269 10*3/uL (ref 150–450)
RBC: 4.35 x10E6/uL (ref 3.77–5.28)
RDW: 12.9 % (ref 11.7–15.4)
WBC: 5.3 10*3/uL (ref 3.4–10.8)

## 2023-07-25 LAB — QUANTIFERON-TB GOLD PLUS
QuantiFERON Mitogen Value: 6.3 [IU]/mL
QuantiFERON Nil Value: 3.11 [IU]/mL
QuantiFERON TB1 Ag Value: 2.98 [IU]/mL
QuantiFERON TB2 Ag Value: 2.9 [IU]/mL
QuantiFERON-TB Gold Plus: NEGATIVE

## 2023-07-25 LAB — COMPREHENSIVE METABOLIC PANEL
ALT: 126 IU/L — ABNORMAL HIGH (ref 0–32)
AST: 101 IU/L — ABNORMAL HIGH (ref 0–40)
Albumin: 4.9 g/dL (ref 3.8–4.9)
Alkaline Phosphatase: 107 IU/L (ref 44–121)
BUN/Creatinine Ratio: 21 (ref 9–23)
BUN: 14 mg/dL (ref 6–24)
Bilirubin Total: 0.5 mg/dL (ref 0.0–1.2)
CO2: 22 mmol/L (ref 20–29)
Calcium: 9.9 mg/dL (ref 8.7–10.2)
Chloride: 101 mmol/L (ref 96–106)
Creatinine, Ser: 0.68 mg/dL (ref 0.57–1.00)
Globulin, Total: 3 g/dL (ref 1.5–4.5)
Glucose: 215 mg/dL — ABNORMAL HIGH (ref 70–99)
Potassium: 4.6 mmol/L (ref 3.5–5.2)
Sodium: 140 mmol/L (ref 134–144)
Total Protein: 7.9 g/dL (ref 6.0–8.5)
eGFR: 103 mL/min/{1.73_m2} (ref >59)

## 2023-07-25 LAB — HEPATITIS B SURFACE ANTIGEN: Hepatitis B Surface Ag: NEGATIVE

## 2023-07-25 LAB — HIV ANTIBODY (ROUTINE TESTING W REFLEX): HIV Screen 4th Generation wRfx: NONREACTIVE

## 2023-07-25 LAB — HEPATITIS B CORE ANTIBODY, TOTAL: Hep B Core Total Ab: NEGATIVE

## 2023-07-25 NOTE — Telephone Encounter (Signed)
[employer] faxed another form (Continue)-faxed completed form and faxed along with clinicals to [phone].

## 2023-07-28 MED ORDER — AMPHETAMINE-DEXTROAMPHET ER 25 MG PO CP24: 25.0000 mg | ORAL_CAPSULE | Freq: Every day | ORAL | 0 refills | Status: DC

## 2023-07-28 NOTE — Telephone Encounter (Signed)
Dr.Sater the Rx was not set on correct pharmacy for adderall, pt would like Rx to be sent to [facility], it is now set on correct pharmacy pending to be signed.   Last filled on 06/03/23 #30 tablets (30 day supply)  I called and [facility] and that pt is getting Rx sent to [facility]

## 2023-07-29 DIAGNOSIS — Z79899 Other long term (current) drug therapy: Secondary | ICD-10-CM

## 2023-07-29 DIAGNOSIS — G35 Multiple sclerosis: Secondary | ICD-10-CM

## 2023-07-30 NOTE — Telephone Encounter (Signed)
Received order Rx and service request form for mavenclad to complete. Also sent to PA team for determination started 07-16-2023.

## 2023-07-31 ENCOUNTER — Other Ambulatory Visit (HOSPITAL_COMMUNITY): Payer: Self-pay

## 2023-07-31 NOTE — Telephone Encounter (Signed)
I reached out to [employer] to f/u on PA-  The initial PA submitted was denied-see below    Per the rep a provider courtesy review was performed and that as well was denied due to:  The same reason above-Per the rep the next step would be A. Request a 1st level appeal or B. Change the medication to one they suggested   Both denial letter will be placed in the chart under the media tab.

## 2023-07-31 NOTE — Telephone Encounter (Signed)
I don't see where we have sent in the second year RX for Mavenclad, so I'm not sure why we are doing a PA yet. Her lymphocyte count is to be rechecked in 2 months and then Dr. Epimenio Foot will decide on the second year.  Let me know when the [facility] start form is sent in and then we can revisit this PA.

## 2023-08-01 NOTE — Telephone Encounter (Signed)
We are waiting for her repeat labs CBC DIFF around 09/2023 due.

## 2023-08-07 ENCOUNTER — Telehealth: Payer: Self-pay | Admitting: Neurology

## 2023-08-07 DIAGNOSIS — R269 Unspecified abnormalities of gait and mobility: Secondary | ICD-10-CM

## 2023-08-07 DIAGNOSIS — R29898 Other symptoms and signs involving the musculoskeletal system: Secondary | ICD-10-CM

## 2023-08-07 NOTE — Telephone Encounter (Signed)
Mri orders updated to stat, thanks

## 2023-08-07 NOTE — Telephone Encounter (Signed)
Pt is asking for a call to discuss Pain, numbness,weakness, and heaviness in left leg

## 2023-08-07 NOTE — Telephone Encounter (Signed)
Per Dr. Delena Bali,  if she's having new onset weakness or difficulty ambulating she should go to the ED to rule out new ms exacerbation. Otherwise she has an mri scheduled for next month and i can change that order to stat to try and get it done sooner.  I spoke to pt and relayed this above. She is ok to have STAT MRI order done.  [facility] or [facility] in [place].   I said that authorization may not be done today due to lateness.  Pt verbalized understanding.

## 2023-08-07 NOTE — Telephone Encounter (Signed)
[facility]: [phone] exp. 08/07/23-10/05/23 sent to [facility] in [facility]  [address] [place], [place] [address] [phone]  Fax: [phone]

## 2023-08-07 NOTE — Telephone Encounter (Signed)
I called pt she is having since Tuesday fatigue, L side body pain, nerve prickling, L foot electrical shock.  L  leg weakness, heaviness.  ? MS exacerbation.  She is Mavenclad therapy, due coming up next few months 2nd year.  [facility] [place] please advise.

## 2023-08-11 ENCOUNTER — Encounter: Payer: Self-pay | Admitting: Neurology

## 2023-08-13 NOTE — Telephone Encounter (Signed)
She is scheduled at [facility] for October 2. They said if we do not receive the report in 4 business days to call [phone].

## 2023-08-14 NOTE — Telephone Encounter (Signed)
Per previous notes

## 2023-08-14 NOTE — Telephone Encounter (Signed)
[name] from [facility] lif[employer](case manager [phone]) has called to report that PA clearance is needed

## 2023-09-03 ENCOUNTER — Ambulatory Visit: Payer: Self-pay | Admitting: Neurology

## 2023-09-04 ENCOUNTER — Other Ambulatory Visit: Payer: Self-pay

## 2023-09-04 ENCOUNTER — Encounter: Payer: Self-pay | Admitting: Neurology

## 2023-09-04 ENCOUNTER — Other Ambulatory Visit: Payer: Self-pay | Admitting: Neurology

## 2023-09-04 MED ORDER — AMPHETAMINE-DEXTROAMPHET ER 25 MG PO CP24: 25.0000 mg | ORAL_CAPSULE | Freq: Every day | ORAL | 0 refills | Status: DC

## 2023-09-04 MED ORDER — AMPHETAMINE-DEXTROAMPHET ER 25 MG PO CP24: 25.0000 mg | ORAL_CAPSULE | Freq: Every day | ORAL | 0 refills | Status: AC

## 2023-09-04 NOTE — Telephone Encounter (Signed)
Last seen on 07/22/23 Follow up scheduled on 01/28/24 Last filled on 07/30/24 #30 tablets (30 day supply) Rx pending to be signed

## 2023-09-04 NOTE — Telephone Encounter (Signed)
Pt last seen 08/01/2023 Upcoming Appointment 01/28/2024  Adderall Last Filled 07/31/2023 Escript 09/04/2023

## 2023-10-02 NOTE — Telephone Encounter (Addendum)
   Form faxed to [facility] [employer], confirmation received.   I confirmed with Dr.Sater that okay to fax MS based on labs.

## 2023-10-02 NOTE — Telephone Encounter (Signed)
Form pending to be signed by Dr.Sater.

## 2023-10-02 NOTE — Telephone Encounter (Addendum)
It appears that patient had a repeat CBC diff on 09/26/2023 through [facility]. Absolute Lymphocyte count improved to 1.3. Okay for patient to have second year of Mavenclad?

## 2023-10-06 NOTE — Telephone Encounter (Signed)
I called patient to discuss her second year of Mavenclad. She may be seeing another neurologist now. No answer, left a message asking her to call us back.

## 2023-10-09 NOTE — Telephone Encounter (Signed)
Called Addy/[facility] [employer] at [phone]. Asked her to cx start form for [facility] under Dr. Epimenio Foot since pt has transferred care. Asked her to call back to confirm she received message.  I also called [facility] at [phone]. Spoke w/ Liborio Nixon. She will cx things under Dr. Epimenio Foot.

## 2023-10-10 NOTE — Telephone Encounter (Signed)
[facility] Bridget Lawrence) calling back to let you know I got the message for the patient.

## 2023-10-13 NOTE — Telephone Encounter (Signed)
Noted  

## 2024-01-28 ENCOUNTER — Ambulatory Visit: Payer: Medicaid Other | Admitting: Neurology

## 2024-07-21 ENCOUNTER — Ambulatory Visit: Payer: Self-pay | Admitting: Urology

## 2024-07-21 ENCOUNTER — Encounter: Payer: Self-pay | Admitting: Urology

## 2024-07-21 VITALS — BP 110/79 | HR 84 | Ht 67.0 in | Wt 229.0 lb

## 2024-07-21 DIAGNOSIS — Z87440 Personal history of urinary (tract) infections: Secondary | ICD-10-CM

## 2024-07-21 DIAGNOSIS — R339 Retention of urine, unspecified: Secondary | ICD-10-CM

## 2024-07-21 DIAGNOSIS — R8290 Unspecified abnormal findings in urine: Secondary | ICD-10-CM

## 2024-07-21 DIAGNOSIS — G35 Multiple sclerosis: Secondary | ICD-10-CM

## 2024-07-21 LAB — URINALYSIS, ROUTINE W REFLEX MICROSCOPIC
Bilirubin, UA: NEGATIVE
Ketones, UA: NEGATIVE
Leukocytes,UA: NEGATIVE
Nitrite, UA: POSITIVE — AB
Protein,UA: NEGATIVE
Specific Gravity, UA: 1.01 (ref 1.005–1.030)
Urobilinogen, Ur: 0.2 mg/dL (ref 0.2–1.0)
pH, UA: 5.5 (ref 5.0–7.5)

## 2024-07-21 LAB — MICROSCOPIC EXAMINATION

## 2024-07-21 MED ORDER — METHENAMINE HIPPURATE 1 G PO TABS: 1.0000 g | ORAL_TABLET | Freq: Two times a day (BID) | ORAL | Status: AC

## 2024-07-21 NOTE — Progress Notes (Signed)
 Assessment: 1. Urinary retention   2. Multiple sclerosis (HCC)   3. History of UTI   4. Abnormal urine findings     Plan: I personally reviewed the patient's chart including provider notes, lab and imaging results. I reviewed records from [facility] urology as well as from her recent hospitalization at [facility]. Urine culture sent today -will contact with results. Continue CIC 5 times per day. Continue methenamine  1000 mg twice daily. Return to office in 3 months  Chief Complaint:  Chief Complaint  Patient presents with   Urinary Retention    History of Present Illness:  Bridget Lawrence is a 53 y.o. 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  She has previously been followed by Dr. Alfonzo with [facility] and was last seen in December 2024.  She is been managed with intermittent catheterization 4-5 times per day.  She has been on methenamine  1000 mg twice daily.  She had not had any symptomatic UTIs at the time of her visit in 12/24. Renal ultrasound from 12/24 showed no hydronephrosis or mass. She was admitted to [facility] for sepsis in July 2025.  Her symptoms included chills and cramps. Urine culture results: 05/22/2024 no growth     Blood cx + for E. coli 06/29/2024 no growth  CT abdomen pelvis from 7/25 showed no evidence of mass or obstruction.  She continues to catheterize 5 times per day.  No problems with catheterization.  She has had some intermittent chills and cramps.  No gross hematuria.  She continues on methenamine  twice daily for UTI prevention.   Past Medical History:  Past Medical History:  Diagnosis Date   Diabetes (HCC)    High blood pressure    Migraine variant with headache 06/15/2019    Past Surgical History:  Past Surgical History:  Procedure Laterality Date   ANKLE SURGERY Right 11/19/2015   HERNIA REPAIR  2019    Allergies:  Allergies  Allergen Reactions    Hydrocodone-Acetaminophen Itching and Rash   Topiramate  Other (See Comments), Itching, Photosensitivity and Swelling    Tingling of fingers and toes /altered taste and smell  Tingling and altered taste and smell    Tingling of fingers and toes    Altered sense of smell    Other reaction(s): Other (see comments)    Tingling of fingers and toes /altered taste and smell    Tingling and altered taste and smell Tingling of fingers and toes /altered taste and smell Tingling and altered taste and smell Tingling of fingers and toes Altered sense of smell    Tingling and altered taste and smell Tingling of fingers and toes Altered sense of smell  Tingling of fingers and toes /altered taste and smell    Tingling and altered taste and smell    Tingling and altered taste and smell Tingling of fingers and toes /altered taste and smell Tingling and altered taste and smell Tingling of fingers and toes Altered sense of smell    Tingling and altered taste and smell Tingling of fingers and toes Altered sense of smell  Tingling and altered taste and smell    Tingling of fingers and toes /altered taste and smell    Tingling of fingers and toes    Altered sense of smell    Other Reaction(s): Other (See Comments)   Wound Dressing Adhesive Hives, Itching, Rash and Swelling   Ciprofloxacin Swelling    Leg swelling and  pain   Doxycycline Other (See Comments)    Pt states, I don't remember.'     Gabapentin Other (See Comments)    Slurred speech.       Family History:  Family History  Problem Relation Age of Onset   Heart attack Mother    Diabetes Mother    High blood pressure Mother    Stroke Mother    Cancer Father    Heart attack Sister    Diabetes Sister    High blood pressure Sister    Diabetes Brother    High blood pressure Brother    Multiple sclerosis Brother    Stroke Maternal Grandmother    Multiple sclerosis Niece     Social History:  Social History   Tobacco  Use   Smoking status: Never   Smokeless tobacco: Never  Vaping Use   Vaping status: Never Used  Substance Use Topics   Alcohol use: Yes    Comment: 4/wk   Drug use: Never    Review of symptoms:  Constitutional:  Negative for unexplained weight loss, night sweats, fever, chills ENT:  Negative for nose bleeds, sinus pain, painful swallowing CV:  Negative for chest pain, shortness of breath, exercise intolerance, palpitations, loss of consciousness Resp:  Negative for cough, wheezing, shortness of breath GI:  Negative for nausea, vomiting, diarrhea, bloody stools GU:  Positives noted in HPI; otherwise negative for gross hematuria, dysuria, urinary incontinence Neuro:  Negative for seizures, poor balance, limb weakness, slurred speech Psych:  Negative for lack of energy, depression, anxiety Endocrine:  Negative for polydipsia, polyuria, symptoms of hypoglycemia (dizziness, hunger, sweating) Hematologic:  Negative for anemia, purpura, petechia, prolonged or excessive bleeding, use of anticoagulants  Allergic:  Negative for difficulty breathing or choking as a result of exposure to anything; no shellfish allergy; no allergic response (rash/itch) to materials, foods  Physical exam: BP 110/79   Pulse 84   Ht 5' 7 (1.702 m)   Wt 229 lb (103.9 kg)   BMI 35.87 kg/m  GENERAL APPEARANCE:  Well appearing, well developed, well nourished, NAD HEENT: Atraumatic, Normocephalic, oropharynx clear. NECK: Supple without lymphadenopathy or thyromegaly. LUNGS: Clear to auscultation bilaterally. HEART: Regular Rate and Rhythm without murmurs, gallops, or rubs. ABDOMEN: Soft, non-tender, No Masses. EXTREMITIES: Moves all extremities well.  Without clubbing, cyanosis, or edema. NEUROLOGIC:  Alert and oriented x 3, CN II-XII grossly intact.  MENTAL STATUS:  Appropriate. BACK:  Non-tender to palpation.  No CVAT SKIN:  Warm, dry and intact.    Results: U/A: 0-5 WBCs, 3-10 RBCs, moderate bacteria,  nitrite positive

## 2024-07-24 ENCOUNTER — Ambulatory Visit: Payer: Self-pay | Admitting: Urology

## 2024-07-24 DIAGNOSIS — R8290 Unspecified abnormal findings in urine: Secondary | ICD-10-CM

## 2024-07-24 LAB — URINE CULTURE

## 2024-07-24 MED ORDER — SULFAMETHOXAZOLE-TRIMETHOPRIM 800-160 MG PO TABS: 1.0000 | ORAL_TABLET | Freq: Two times a day (BID) | ORAL | 0 refills | Status: AC

## 2024-07-27 ENCOUNTER — Other Ambulatory Visit: Payer: Self-pay | Admitting: Urology

## 2024-07-27 MED ORDER — FLUCONAZOLE 100 MG PO TABS: 100.0000 mg | ORAL_TABLET | Freq: Every day | ORAL | 0 refills | Status: AC

## 2024-10-20 ENCOUNTER — Ambulatory Visit: Payer: Self-pay | Admitting: Urology
